# Patient Record
Sex: Male | Born: 1970 | Race: White | Hispanic: No | Marital: Married | Smoking: Former smoker
Health system: Southern US, Community
[De-identification: ages and names within clinical notes are randomized; demographics above are authoritative.]

## PROBLEM LIST (undated history)

## (undated) DIAGNOSIS — R351 Nocturia: Secondary | ICD-10-CM

## (undated) DIAGNOSIS — Z87442 Personal history of urinary calculi: Secondary | ICD-10-CM

## (undated) HISTORY — PX: WISDOM TOOTH EXTRACTION: SHX21

---

## 2016-05-20 ENCOUNTER — Ambulatory Visit (INDEPENDENT_AMBULATORY_CARE_PROVIDER_SITE_OTHER): Payer: BLUE CROSS/BLUE SHIELD | Admitting: Physician Assistant

## 2016-05-20 VITALS — BP 130/90 | HR 65 | Temp 97.7°F | Resp 18 | Ht 71.0 in | Wt 246.0 lb

## 2016-05-20 DIAGNOSIS — Z23 Encounter for immunization: Secondary | ICD-10-CM

## 2016-05-20 DIAGNOSIS — Z1329 Encounter for screening for other suspected endocrine disorder: Secondary | ICD-10-CM | POA: Diagnosis not present

## 2016-05-20 DIAGNOSIS — Z1389 Encounter for screening for other disorder: Secondary | ICD-10-CM | POA: Diagnosis not present

## 2016-05-20 DIAGNOSIS — Z130 Encounter for screening for diseases of the blood and blood-forming organs and certain disorders involving the immune mechanism: Secondary | ICD-10-CM | POA: Diagnosis not present

## 2016-05-20 DIAGNOSIS — R319 Hematuria, unspecified: Secondary | ICD-10-CM | POA: Insufficient documentation

## 2016-05-20 DIAGNOSIS — Z13220 Encounter for screening for lipoid disorders: Secondary | ICD-10-CM | POA: Diagnosis not present

## 2016-05-20 DIAGNOSIS — Z125 Encounter for screening for malignant neoplasm of prostate: Secondary | ICD-10-CM | POA: Diagnosis not present

## 2016-05-20 DIAGNOSIS — Z0000 Encounter for general adult medical examination without abnormal findings: Secondary | ICD-10-CM | POA: Diagnosis not present

## 2016-05-20 LAB — COMPREHENSIVE METABOLIC PANEL
ALT: 17 U/L (ref 9–46)
AST: 16 U/L (ref 10–40)
Albumin: 4.4 g/dL (ref 3.6–5.1)
Alkaline Phosphatase: 58 U/L (ref 40–115)
BUN: 10 mg/dL (ref 7–25)
CHLORIDE: 105 mmol/L (ref 98–110)
CO2: 28 mmol/L (ref 20–31)
CREATININE: 0.96 mg/dL (ref 0.60–1.35)
Calcium: 9.5 mg/dL (ref 8.6–10.3)
GLUCOSE: 91 mg/dL (ref 65–99)
Potassium: 4.3 mmol/L (ref 3.5–5.3)
SODIUM: 139 mmol/L (ref 135–146)
Total Bilirubin: 0.4 mg/dL (ref 0.2–1.2)
Total Protein: 6.9 g/dL (ref 6.1–8.1)

## 2016-05-20 LAB — LIPID PANEL
CHOL/HDL RATIO: 5.3 ratio — AB (ref <=5.0)
CHOLESTEROL: 218 mg/dL — AB (ref 125–200)
HDL: 41 mg/dL (ref >=40)
LDL Cholesterol: 154 mg/dL — ABNORMAL HIGH (ref <130)
Triglycerides: 116 mg/dL (ref <150)
VLDL: 23 mg/dL (ref <30)

## 2016-05-20 LAB — POCT UA - MICROSCOPIC ONLY: MUCUS UA: ABSENT

## 2016-05-20 LAB — CBC
HCT: 46.7 % (ref 38.5–50.0)
Hemoglobin: 16.1 g/dL (ref 13.2–17.1)
MCH: 31.4 pg (ref 27.0–33.0)
MCHC: 34.5 g/dL (ref 32.0–36.0)
MCV: 91 fL (ref 80.0–100.0)
MPV: 10.9 fL (ref 7.5–12.5)
PLATELETS: 212 10*3/uL (ref 140–400)
RBC: 5.13 MIL/uL (ref 4.20–5.80)
RDW: 13.2 % (ref 11.0–15.0)
WBC: 5.8 10*3/uL (ref 3.8–10.8)

## 2016-05-20 LAB — TSH: TSH: 1.84 mIU/L (ref 0.40–4.50)

## 2016-05-20 NOTE — Progress Notes (Signed)
Patient ID: Seraphin Childrey, male    DOB: 06-Nov-1970, 45 y.o.   MRN: [id number]  PCP: No PCP Per Patient  Chief Complaint  Patient presents with  . Annual Exam  . Hematuria    x68month. Hx of kidney stones. Urine collected.    Subjective:   HPI:  56 yom presents for CPE, mentioning hematuria.   Has not been for CPE in 30 years. No medicines at home. 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 Wears a pedometer, logging between 10-16k steps per day. He does not do formal exercise as he is busy and active with work. Does not follow a specific diet. Eats 2-3 servings veggies daily but fruit only 2-3 times per week. As he is busy with long hours eats most meals out at fast food.  Married. Three children. One child still in the house.  Drinks 2-3 beers per week. No drug use. Smoker. Has smoked for 30 years, approx 1 ppd during that time. Has recently decided to cut down. Now smoking 2-3 cigs per day.   Has dentist appt later today. Planning to schedule optometrist appt soon. Unsure last tdap vaccine he had.   Also mentions hx hematuria. Had issues with this 5 years ago at which time was diagnosed with recurrent kidney stones. Unsure what cause was behind them but has not had in 5 years. Now for past month has noticed occasional dark urine every few days. Denies dysuria or flank pain. Denies fevers or chills.   There are no active problems to display for this patient.   Past Medical History:  Diagnosis Date  . Kidney stones      Prior to Admission medications   Not on File    Not on File  No past surgical history on file.  Family History  Problem Relation Age of Onset  . Hyperlipidemia Mother   . Hypertension Mother   . Cancer Father   . Hypertension Father     Social History   Social History  . Marital status: Married    Spouse name: N/A  . Number of children: N/A  . Years of education: N/A   Social History Main Topics  . Smoking status: Current Every Day  Smoker  . Smokeless tobacco: None  . Alcohol use None  . Drug use: Unknown  . Sexual activity: Not Asked   Other Topics Concern  . None   Social History Narrative  . None    Review of Systems Negative unless listed below: Visual problems - worsening vision, eye doctor soon as above  Chest tightness - One episode approx 4 months ago that lasted 2-3 weeks. Under lot of stress at time. Was non exertional. Has not had any since. Has never had exertional CP.  Increased urination - Past few weeks  Urinary freq - Past few weeks  Blood in urine - As above  Color change - Skin slightly darker     Objective:  Physical Exam  Constitutional: He is oriented to person, place, and time. He appears well-developed and well-nourished.  Non-toxic appearance. He does not have a sickly appearance. He does not appear ill. No distress.  HENT:  Right Ear: Tympanic membrane normal.  Left Ear: Tympanic membrane normal.  Nose: Right sinus exhibits no maxillary sinus tenderness and no frontal sinus tenderness. Left sinus exhibits no maxillary sinus tenderness and no frontal sinus tenderness.  Mouth/Throat: Uvula is midline, oropharynx is clear and moist and mucous membranes are normal.  Eyes: Conjunctivae  and EOM are normal. Pupils are equal, round, and reactive to light.  Neck: Trachea normal and normal range of motion. Normal carotid pulses, no hepatojugular reflux and no JVD present. Carotid bruit is not present. No thyroid mass and no thyromegaly present.  Cardiovascular: Normal rate, regular rhythm and normal heart sounds.  PMI is not displaced.   Pulses:      Dorsalis pedis pulses are 2+ on the right side, and 2+ on the left side.       Posterior tibial pulses are 2+ on the right side, and 2+ on the left side.  Pulmonary/Chest: Effort normal and breath sounds normal. No tachypnea.  Abdominal: Soft. Normal appearance and bowel sounds are normal. There is no hepatosplenomegaly. There is no tenderness.  There is no CVA tenderness. No hernia.  Musculoskeletal: Normal range of motion.  Lymphadenopathy:       Head (right side): No submental, no submandibular and no tonsillar adenopathy present.       Head (left side): No submental, no submandibular and no tonsillar adenopathy present.    He has no cervical adenopathy.  Neurological: He is alert and oriented to person, place, and time. He has normal strength. No cranial nerve deficit or sensory deficit.  Psychiatric: He has a normal mood and affect. His speech is normal and behavior is normal.   BP 130/90   Pulse 65   Temp 97.7 F (36.5 C) (Oral)   Resp 18   Ht  (1.803 m)   Wt 246 lb (111.6 kg)   SpO2 96%   BMI 34.31 kg/m   Results for orders placed or performed in visit on 05/20/16  POCT UA - Microscopic Only  Result Value Ref Range   WBC, Ur, HPF, POC none    RBC, urine, microscopic moderate    Bacteria, U Microscopic none    Mucus, UA absent    Epithelial cells, urine per micros few    Crystals, Ur, HPF, POC none    Casts, Ur, LPF, POC none    Yeast, UA none     Assessment & Plan:   Annual physical exam  Screening for deficiency anemia - Plan: CBC  Screening for nephropathy - Plan: Comprehensive metabolic panel  Screening for thyroid disorder - Plan: TSH  Screening for hyperlipidemia - Plan: Lipid panel  Need for Tdap vaccination - Plan: Tdap vaccine greater than or equal to 7yo IM  Hematuria, unspecified - Plan: POCT UA - Microscopic Only, Ambulatory referral to Urology  Screening for prostate cancer - Plan: PSA --cpe today, normal exam with no concerning findings --cbc, cmp, lipids, psa (after discussion weighing pros and cons with pt), tsh today --tdap vaccination today --encouraged regular exercise routine  --rbcs noted on ua after pt complaints of one month of intermittent gross hematuria at home - no unilateral flank pain or cva ttp to suggest renal stone, no recent uri to suggest postinfectious  cause, no signs infection to suggest uti --will refer to urology for possible imaging in this male with significant smoking history. Checking PSA as well to consider BPH as cause   Donnajean Lopes, PA-C Physician Assistant-Certified [facility]  05/20/2016 10:10 PM

## 2016-05-20 NOTE — Patient Instructions (Addendum)
It was wise to come in today for a physical exam.  Your exam was normal.  We are checking multiple labs today and will be in contact with you when these results are back.  You received the tdap vaccine today which will be good for 10 years. You did have red blood cells in your urine today so I've referred you to urology for further work up.   Health Maintenance, Male A healthy lifestyle and preventative care can promote health and wellness.  Maintain regular health, dental, and eye exams.  Eat a healthy diet. Foods like vegetables, fruits, whole grains, low-fat dairy products, and lean protein foods contain the nutrients you need and are low in calories. Decrease your intake of foods high in solid fats, added sugars, and salt. Get information about a proper diet from your health care provider, if necessary.  Regular physical exercise is one of the most important things you can do for your health. Most adults should get at least 150 minutes of moderate-intensity exercise (any activity that increases your heart rate and causes you to sweat) each week. In addition, most adults need muscle-strengthening exercises on 2 or more days a week.   Maintain a healthy weight. The body mass index (BMI) is a screening tool to identify possible weight problems. It provides an estimate of body fat based on height and weight. Your health care provider can find your BMI and can help you achieve or maintain a healthy weight. For males 20 years and older:  A BMI below 18.5 is considered underweight.  A BMI of 18.5 to 24.9 is normal.  A BMI of 25 to 29.9 is considered overweight.  A BMI of 30 and above is considered obese.  Maintain normal blood lipids and cholesterol by exercising and minimizing your intake of saturated fat. Eat a balanced diet with plenty of fruits and vegetables. Blood tests for lipids and cholesterol should begin at age 9 and be repeated every 5 years. If your lipid or cholesterol levels  are high, you are over age 38, or you are at high risk for heart disease, you may need your cholesterol levels checked more frequently.Ongoing high lipid and cholesterol levels should be treated with medicines if diet and exercise are not working.  If you smoke, find out from your health care provider how to quit. If you do not use tobacco, do not start.  Lung cancer screening is recommended for adults aged 55-80 years who are at high risk for developing lung cancer because of a history of smoking. A yearly low-dose CT scan of the lungs is recommended for people who have at least a 30-pack-year history of smoking and are current smokers or have quit within the past 15 years. A pack year of smoking is smoking an average of 1 pack of cigarettes a day for 1 year (for example, a 30-pack-year history of smoking could mean smoking 1 pack a day for 30 years or 2 packs a day for 15 years). Yearly screening should continue until the smoker has stopped smoking for at least 15 years. Yearly screening should be stopped for people who develop a health problem that would prevent them from having lung cancer treatment.  If you choose to drink alcohol, do not have more than 2 drinks per day. One drink is considered to be 12 oz (360 mL) of beer, 5 oz (150 mL) of wine, or 1.5 oz (45 mL) of liquor.  Avoid the use of street drugs. Do not  share needles with anyone. Ask for help if you need support or instructions about stopping the use of drugs.  High blood pressure causes heart disease and increases the risk of stroke. High blood pressure is more likely to develop in:  People who have blood pressure in the end of the normal range (100-139/85-89 mm Hg).  People who are overweight or obese.  People who are African American.  If you are 20-62 years of age, have your blood pressure checked every 3-5 years. If you are 67 years of age or older, have your blood pressure checked every year. You should have your blood  pressure measured twice--once when you are at a hospital or clinic, and once when you are not at a hospital or clinic. Record the average of the two measurements. To check your blood pressure when you are not at a hospital or clinic, you can use:  An automated blood pressure machine at a pharmacy.  A home blood pressure monitor.  If you are 70-11 years old, ask your health care provider if you should take aspirin to prevent heart disease.  Diabetes screening involves taking a blood sample to check your fasting blood sugar level. This should be done once every 3 years after age 96 if you are at a normal weight and without risk factors for diabetes. Testing should be considered at a younger age or be carried out more frequently if you are overweight and have at least 1 risk factor for diabetes.  Colorectal cancer can be detected and often prevented. Most routine colorectal cancer screening begins at the age of 39 and continues through age 41. However, your health care provider may recommend screening at an earlier age if you have risk factors for colon cancer. On a yearly basis, your health care provider may provide home test kits to check for hidden blood in the stool. A small camera at the end of a tube may be used to directly examine the colon (sigmoidoscopy or colonoscopy) to detect the earliest forms of colorectal cancer. Talk to your health care provider about this at age 45 when routine screening begins. A direct exam of the colon should be repeated every 5-10 years through age 40, unless early forms of precancerous polyps or small growths are found.  People who are at an increased risk for hepatitis B should be screened for this virus. You are considered at high risk for hepatitis B if:  You were born in a country where hepatitis B occurs often. Talk with your health care provider about which countries are considered high risk.  Your parents were born in a high-risk country and you have not  received a shot to protect against hepatitis B (hepatitis B vaccine).  You have HIV or AIDS.  You use needles to inject street drugs.  You live with, or have sex with, someone who has hepatitis B.  You are a man who has sex with other men (MSM).  You get hemodialysis treatment.  You take certain medicines for conditions like cancer, organ transplantation, and autoimmune conditions.  Hepatitis C blood testing is recommended for all people born from [birth year] through 1967 and any individual with known risk factors for hepatitis C.  Healthy men should no longer receive prostate-specific antigen (PSA) blood tests as part of routine cancer screening. Talk to your health care provider about prostate cancer screening.  Testicular cancer screening is not recommended for adolescents or adult males who have no symptoms. Screening includes self-exam, a health  care provider exam, and other screening tests. Consult with your health care provider about any symptoms you have or any concerns you have about testicular cancer.  Practice safe sex. Use condoms and avoid high-risk sexual practices to reduce the spread of sexually transmitted infections (STIs).  You should be screened for STIs, including gonorrhea and chlamydia if:  You are sexually active and are younger than 24 years.  You are older than 24 years, and your health care provider tells you that you are at risk for this type of infection.  Your sexual activity has changed since you were last screened, and you are at an increased risk for chlamydia or gonorrhea. Ask your health care provider if you are at risk.  If you are at risk of being infected with HIV, it is recommended that you take a prescription medicine daily to prevent HIV infection. This is called pre-exposure prophylaxis (PrEP). You are considered at risk if:  You are a man who has sex with other men (MSM).  You are a heterosexual man who is sexually active with multiple  partners.  You take drugs by injection.  You are sexually active with a partner who has HIV.  Talk with your health care provider about whether you are at high risk of being infected with HIV. If you choose to begin PrEP, you should first be tested for HIV. You should then be tested every 3 months for as long as you are taking PrEP.  Use sunscreen. Apply sunscreen liberally and repeatedly throughout the day. You should seek shade when your shadow is shorter than you. Protect yourself by wearing long sleeves, pants, a wide-brimmed hat, and sunglasses year round whenever you are outdoors.  Tell your health care provider of new moles or changes in moles, especially if there is a change in shape or color. Also, tell your health care provider if a mole is larger than the size of a pencil eraser.  A one-time screening for abdominal aortic aneurysm (AAA) and surgical repair of large AAAs by ultrasound is recommended for men aged 65-75 years who are current or former smokers.  Stay current with your vaccines (immunizations).   This information is not intended to replace advice given to you by your health care provider. Make sure you discuss any questions you have with your health care provider.   Document Released: 04/08/2008 Document Revised: 11/01/2014 Document Reviewed: 03/08/2011 Elsevier Interactive Patient Education 2016 [employer].  Tdap Vaccine (Tetanus, Diphtheria and Pertussis): What You Need to Know 1. Why get vaccinated? Tetanus, diphtheria and pertussis are very serious diseases. Tdap vaccine can protect [place] from these diseases. And, Tdap vaccine given to pregnant women can protect newborn babies against pertussis. TETANUS (Lockjaw) is rare in the [place] today. It causes painful muscle tightening and stiffness, usually all over the body.  It can lead to tightening of muscles in the head and neck so you can't open your mouth, swallow, or sometimes even breathe. Tetanus kills  about 1 out of 10 people who are infected even after receiving the best medical care. DIPHTHERIA is also rare in the [place] today. It can cause a thick coating to form in the back of the throat.  It can lead to breathing problems, heart failure, paralysis, and death. PERTUSSIS (Whooping Cough) causes severe coughing spells, which can cause difficulty breathing, vomiting and disturbed sleep.  It can also lead to weight loss, incontinence, and rib fractures. Up to 2 in 100 adolescents and 5  in 100 adults with pertussis are hospitalized or have complications, which could include pneumonia or death. These diseases are caused by bacteria. Diphtheria and pertussis are spread from person to person through secretions from coughing or sneezing. Tetanus enters the body through cuts, scratches, or wounds. Before vaccines, as many as 200,000 cases of diphtheria, 200,000 cases of pertussis, and hundreds of cases of tetanus, were reported in the [place] each year. Since vaccination began, reports of cases for tetanus and diphtheria have dropped by about 99% and for pertussis by about 80%. 2. Tdap vaccine Tdap vaccine can protect adolescents and adults from tetanus, diphtheria, and pertussis. One dose of Tdap is routinely given at age 73 or 50. People who did not get Tdap at that age should get it as soon as possible. Tdap is especially important for healthcare professionals and anyone having close contact with a baby younger than 12 months. Pregnant women should get a dose of Tdap during every pregnancy, to protect the newborn from pertussis. Infants are most at risk for severe, life-threatening complications from pertussis. Another vaccine, called Td, protects against tetanus and diphtheria, but not pertussis. A Td booster should be given every 10 years. Tdap may be given as one of these boosters if you have never gotten Tdap before. Tdap may also be given after a severe cut or burn to prevent tetanus  infection. Your doctor or the person giving you the vaccine can give you more information. Tdap may safely be given at the same time as other vaccines. 3. Some people should not get this vaccine  A person who has ever had a life-threatening allergic reaction after a previous dose of any diphtheria, tetanus or pertussis containing vaccine, OR has a severe allergy to any part of this vaccine, should not get Tdap vaccine. Tell the person giving the vaccine about any severe allergies.  Anyone who had coma or long repeated seizures within 7 days after a childhood dose of DTP or DTaP, or a previous dose of Tdap, should not get Tdap, unless a cause other than the vaccine was found. They can still get Td.  Talk to your doctor if you:  have seizures or another nervous system problem,  had severe pain or swelling after any vaccine containing diphtheria, tetanus or pertussis,  ever had a condition called Guillain-Barr Syndrome (GBS),  aren't feeling well on the day the shot is scheduled. 4. Risks With any medicine, including vaccines, there is a chance of side effects. These are usually mild and go away on their own. Serious reactions are also possible but are rare. Most people who get Tdap vaccine do not have any problems with it. Mild problems following Tdap (Did not interfere with activities)  Pain where the shot was given (about 3 in 4 adolescents or 2 in 3 adults)  Redness or swelling where the shot was given (about 1 person in 5)  Mild fever of at least 100.51F (up to about 1 in 25 adolescents or 1 in 100 adults)  Headache (about 3 or 4 people in 10)  Tiredness (about 1 person in 3 or 4)  Nausea, vomiting, diarrhea, stomach ache (up to 1 in 4 adolescents or 1 in 10 adults)  Chills, sore joints (about 1 person in 10)  Body aches (about 1 person in 3 or 4)  Rash, swollen glands (uncommon) Moderate problems following Tdap (Interfered with activities, but did not require medical  attention)  Pain where the shot was given (up to 1  in 5 or 6)  Redness or swelling where the shot was given (up to about 1 in 16 adolescents or 1 in 12 adults)  Fever over 102F (about 1 in 100 adolescents or 1 in 250 adults)  Headache (about 1 in 7 adolescents or 1 in 10 adults)  Nausea, vomiting, diarrhea, stomach ache (up to 1 or 3 people in 100)  Swelling of the entire arm where the shot was given (up to about 1 in 500). Severe problems following Tdap (Unable to perform usual activities; required medical attention)  Swelling, severe pain, bleeding and redness in the arm where the shot was given (rare). Problems that could happen after any vaccine:  People sometimes faint after a medical procedure, including vaccination. Sitting or lying down for about 15 minutes can help prevent fainting, and injuries caused by a fall. Tell your doctor if you feel dizzy, or have vision changes or ringing in the ears.  Some people get severe pain in the shoulder and have difficulty moving the arm where a shot was given. This happens very rarely.  Any medication can cause a severe allergic reaction. Such reactions from a vaccine are very rare, estimated at fewer than 1 in a million doses, and would happen within a few minutes to a few hours after the vaccination. As with any medicine, there is a very remote chance of a vaccine causing a serious injury or death. The safety of vaccines is always being monitored. For more information, visit: http://floyd.org/ 5. What if there is a serious problem? What should I look for?  Look for anything that concerns you, such as signs of a severe allergic reaction, very high fever, or unusual behavior.  Signs of a severe allergic reaction can include hives, swelling of the face and throat, difficulty breathing, a fast heartbeat, dizziness, and weakness. These would usually start a few minutes to a few hours after the vaccination. What should I do?  If  you think it is a severe allergic reaction or other emergency that can't wait, call 9-1-1 or get the person to the nearest hospital. Otherwise, call your doctor.  Afterward, the reaction should be reported to the Vaccine Adverse Event Reporting System (VAERS). Your doctor might file this report, or you can do it yourself through the VAERS web site at www.vaers.LAgents.no, or by calling 1-519-876-0086. VAERS does not give medical advice.  6. The National Vaccine Injury Compensation Program The [employer] Vaccine Injury Compensation Program (VICP) is a federal program that was created to compensate people who may have been injured by certain vaccines. Persons who believe they may have been injured by a vaccine can learn about the program and about filing a claim by calling 1-(469)625-1281 or visiting the VICP website at SpiritualWord.at. There is a time limit to file a claim for compensation. 7. How can I learn more?  Ask your doctor. He or she can give you the vaccine package insert or suggest other sources of information.  Call your local or state health department.  Contact the Centers for Disease Control and Prevention (CDC):  Call 667-371-5798 (1-800-CDC-INFO) or  Visit CDC's website at PicCapture.uy CDC Tdap Vaccine VIS (12/18/13)   This information is not intended to replace advice given to you by your health care provider. Make sure you discuss any questions you have with your health care provider.   Document Released: 04/11/2012 Document Revised: 11/01/2014 Document Reviewed: 01/23/2014 Elsevier Interactive Patient Education [employer]

## 2016-05-21 LAB — PSA: PSA: 1.28 ng/mL (ref <=4.00)

## 2016-07-02 ENCOUNTER — Ambulatory Visit (HOSPITAL_COMMUNITY)
Admission: RE | Admit: 2016-07-02 | Discharge: 2016-07-02 | Disposition: A | Discharge status: Home / Self Care | Payer: BLUE CROSS/BLUE SHIELD | Source: Ambulatory Visit | Attending: Urology | Admitting: Urology

## 2016-07-02 ENCOUNTER — Other Ambulatory Visit: Payer: Self-pay | Admitting: Urology

## 2016-07-02 ENCOUNTER — Ambulatory Visit (INDEPENDENT_AMBULATORY_CARE_PROVIDER_SITE_OTHER): Payer: BLUE CROSS/BLUE SHIELD | Admitting: Urology

## 2016-07-02 ENCOUNTER — Encounter (HOSPITAL_COMMUNITY): Payer: Self-pay

## 2016-07-02 DIAGNOSIS — R310 Gross hematuria: Secondary | ICD-10-CM | POA: Insufficient documentation

## 2016-07-02 DIAGNOSIS — Z87442 Personal history of urinary calculi: Secondary | ICD-10-CM

## 2016-07-02 DIAGNOSIS — N200 Calculus of kidney: Secondary | ICD-10-CM | POA: Insufficient documentation

## 2016-07-02 MED ORDER — IOPAMIDOL (ISOVUE-300) INJECTION 61%
INTRAVENOUS | Status: AC
  Administered 2016-07-02: 100 mL
  Filled 2016-07-02: qty 100

## 2016-07-16 ENCOUNTER — Ambulatory Visit (INDEPENDENT_AMBULATORY_CARE_PROVIDER_SITE_OTHER): Payer: BLUE CROSS/BLUE SHIELD | Admitting: Urology

## 2016-07-16 DIAGNOSIS — R310 Gross hematuria: Secondary | ICD-10-CM

## 2016-07-16 DIAGNOSIS — N200 Calculus of kidney: Secondary | ICD-10-CM

## 2016-07-16 DIAGNOSIS — Q6239 Other obstructive defects of renal pelvis and ureter: Secondary | ICD-10-CM

## 2016-07-28 ENCOUNTER — Other Ambulatory Visit: Payer: Self-pay | Admitting: Urology

## 2016-07-28 ENCOUNTER — Ambulatory Visit (INDEPENDENT_AMBULATORY_CARE_PROVIDER_SITE_OTHER): Payer: BLUE CROSS/BLUE SHIELD | Admitting: Urology

## 2016-07-28 DIAGNOSIS — N135 Crossing vessel and stricture of ureter without hydronephrosis: Secondary | ICD-10-CM

## 2016-07-28 NOTE — Progress Notes (Signed)
Pt is being scheduled for preop appt; please place surgical orders in epic. Thanks.  

## 2016-08-13 ENCOUNTER — Encounter (HOSPITAL_COMMUNITY)
Admission: RE | Admit: 2016-08-13 | Discharge: 2016-08-13 | Disposition: A | Discharge status: Home / Self Care | Payer: BLUE CROSS/BLUE SHIELD | Source: Ambulatory Visit | Attending: Urology | Admitting: Urology

## 2016-08-13 ENCOUNTER — Encounter (HOSPITAL_COMMUNITY): Payer: Self-pay

## 2016-08-13 DIAGNOSIS — Z01812 Encounter for preprocedural laboratory examination: Secondary | ICD-10-CM | POA: Insufficient documentation

## 2016-08-13 DIAGNOSIS — Z0183 Encounter for blood typing: Secondary | ICD-10-CM | POA: Insufficient documentation

## 2016-08-13 HISTORY — DX: Nocturia: R35.1

## 2016-08-13 LAB — CBC
HCT: 41.5 % (ref 39.0–52.0)
HEMOGLOBIN: 14.9 g/dL (ref 13.0–17.0)
MCH: 31.4 pg (ref 26.0–34.0)
MCHC: 35.9 g/dL (ref 30.0–36.0)
MCV: 87.4 fL (ref 78.0–100.0)
Platelets: 195 10*3/uL (ref 150–400)
RBC: 4.75 MIL/uL (ref 4.22–5.81)
RDW: 12.6 % (ref 11.5–15.5)
WBC: 5.1 10*3/uL (ref 4.0–10.5)

## 2016-08-13 LAB — ABO/RH: ABO/RH(D): O POS

## 2016-08-13 NOTE — Pre-Procedure Instructions (Signed)
Pt states he has not been to a doctor's appointment or had a physical in over 25 years.  Will get CBC during pre-op visit today.

## 2016-08-13 NOTE — Patient Instructions (Addendum)
Jeremy PigeonScott Cummings  08/13/2016   Your procedure is scheduled on: 08-20-16  [facility]  [facility] to  Short Stay Center at 10:15AM.  Call this number if you have problems the morning of surgery [phone]   Remember: ONLY 1 PERSON MAY GO WITH YOU TO SHORT STAY TO GET  READY MORNING OF YOUR SURGERY.  Do not eat food or drink liquids :After Midnight. Be sure to follow bowel prep instructions from Dr. Dimas MillinMckenzie's office.     Take these medicines the morning of surgery with A SIP OF WATER: None                               You may not have any metal on your body including hair pins and              piercings  Do not wear jewelry, make-up, lotions, powders or perfumes, deodorant             Do not wear nail polish.  Do not shave  48 hours prior to surgery.              Men may shave face and neck.   Do not bring valuables to the hospital. [facility] IS NOT             RESPONSIBLE   FOR VALUABLES.  Contacts, dentures or bridgework may not be worn into surgery.  Leave suitcase in the car. After surgery it may be brought to your room.               Please read over the following fact sheets you were given: _____________________________________________________________________             [facility] Pre[facility] Before surgery, you can play an important role.  Because skin is not sterile, your skin needs to be as free of germs as possible.  You can reduce the number of germs on your skin by washing with CHG (chlorahexidine gluconate) soap before surgery.  CHG is an antiseptic cleaner which kills germs and bonds with the skin to continue killing germs even after washing. Please DO NOT use if you have an allergy to CHG or antibacterial soaps.  If your skin becomes reddened/irritated stop using the CHG and inform your nurse when you arrive at Short Stay. Do not shave (including legs and underarms) for at least  48 hours prior to the first CHG shower.  You may shave your face/neck. Please follow these instructions carefully:  1.  Shower with CHG Soap the night before surgery and the  morning of Surgery.  2.  If you choose to wash your hair, wash your hair first as usual with your  normal  shampoo.  3.  After you shampoo, rinse your hair and body thoroughly to remove the  shampoo.                           4.  Use CHG as you would any other liquid soap.  You can apply chg directly  to the skin and wash                       Gently with a scrungie or clean washcloth.  5.  Apply the CHG Soap to your body ONLY FROM THE NECK DOWN.   Do not use on face/ open                           Wound or open sores. Avoid contact with eyes, ears mouth and genitals (private parts).                       Wash face,  Genitals (private parts) with your normal soap.             6.  Wash thoroughly, paying special attention to the area where your surgery  will be performed.  7.  Thoroughly rinse your body with warm water from the neck down.  8.  DO NOT shower/wash with your normal soap after using and rinsing off  the CHG Soap.                9.  Pat yourself dry with a clean towel.            10.  Wear clean pajamas.            11.  Place clean sheets on your bed the night of your first shower and do not  sleep with pets. Day of Surgery : Do not apply any lotions/deodorants the morning of surgery.  Please wear clean clothes to the hospital/surgery center.  FAILURE TO FOLLOW THESE INSTRUCTIONS MAY RESULT IN THE CANCELLATION OF YOUR SURGERY PATIENT SIGNATURE_________________________________  NURSE SIGNATURE__________________________________  ________________________________________________________________________  WHAT IS A BLOOD TRANSFUSION? Blood Transfusion Information  A transfusion is the replacement of blood or some of its parts. Blood is made up of multiple cells which provide different functions.  Red blood  cells carry oxygen and are used for blood loss replacement.  White blood cells fight against infection.  Platelets control bleeding.  Plasma helps clot blood.  Other blood products are available for specialized needs, such as hemophilia or other clotting disorders. BEFORE THE TRANSFUSION  Who gives blood for transfusions?   Healthy volunteers who are fully evaluated to make sure their blood is safe. This is blood bank blood. Transfusion therapy is the safest it has ever been in the practice of medicine. Before blood is taken from a donor, a complete history is taken to make sure that person has no history of diseases nor engages in risky social behavior (examples are intravenous drug use or sexual activity with multiple partners). The donor's travel history is screened to minimize risk of transmitting infections, such as malaria. The donated blood is tested for signs of infectious diseases, such as HIV and hepatitis. The blood is then tested to be sure it is compatible with you in order to minimize the chance of a transfusion reaction. If you or a relative donates blood, this is often done in anticipation of surgery and is not appropriate for emergency situations. It takes many days to process the donated blood. RISKS AND COMPLICATIONS Although transfusion therapy is very safe and saves many lives, the main dangers of transfusion include:   Getting an infectious disease.  Developing a transfusion reaction. This is an allergic reaction to something in the blood you were given. Every precaution is taken to prevent this. The decision to have a blood transfusion has been considered carefully by your caregiver before blood is given. Blood is not given unless the benefits outweigh the risks. AFTER THE TRANSFUSION  Right after receiving a  blood transfusion, you will usually feel much better and more energetic. This is especially true if your red blood cells have gotten low (anemic). The transfusion  raises the level of the red blood cells which carry oxygen, and this usually causes an energy increase.  The nurse administering the transfusion will monitor you carefully for complications. HOME CARE INSTRUCTIONS  No special instructions are needed after a transfusion. You may find your energy is better. Speak with your caregiver about any limitations on activity for underlying diseases you may have. SEEK MEDICAL CARE IF:   Your condition is not improving after your transfusion.  You develop redness or irritation at the intravenous (IV) site. SEEK IMMEDIATE MEDICAL CARE IF:  Any of the following symptoms occur over the next 12 hours:  Shaking chills.  You have a temperature by mouth above 102 F (38.9 C), not controlled by medicine.  Chest, back, or muscle pain.  People around you feel you are not acting correctly or are confused.  Shortness of breath or difficulty breathing.  Dizziness and fainting.  You get a rash or develop hives.  You have a decrease in urine output.  Your urine turns a dark color or changes to pink, red, or brown. Any of the following symptoms occur over the next 10 days:  You have a temperature by mouth above 102 F (38.9 C), not controlled by medicine.  Shortness of breath.  Weakness after normal activity.  The white part of the eye turns yellow (jaundice).  You have a decrease in the amount of urine or are urinating less often.  Your urine turns a dark color or changes to pink, red, or brown. Document Released: 10/08/2000 Document Revised: 01/03/2012 Document Reviewed: 05/27/2008 [facility] Patient Information 2014 [place], [place].  _______________________________________________________________________

## 2016-08-20 ENCOUNTER — Inpatient Hospital Stay (HOSPITAL_COMMUNITY)
Admission: RE | Admit: 2016-08-20 | Discharge: 2016-08-22 | DRG: 661 | Disposition: A | Discharge status: Home / Self Care | Payer: BLUE CROSS/BLUE SHIELD | Source: Ambulatory Visit | Attending: Urology | Admitting: Urology

## 2016-08-20 ENCOUNTER — Inpatient Hospital Stay (HOSPITAL_COMMUNITY): Payer: BLUE CROSS/BLUE SHIELD | Admitting: Certified Registered Nurse Anesthetist

## 2016-08-20 ENCOUNTER — Encounter (HOSPITAL_COMMUNITY)
Admission: RE | Disposition: A | Discharge status: Home / Self Care | Payer: Self-pay | Source: Ambulatory Visit | Attending: Urology

## 2016-08-20 ENCOUNTER — Encounter (HOSPITAL_COMMUNITY): Payer: Self-pay | Admitting: *Deleted

## 2016-08-20 ENCOUNTER — Inpatient Hospital Stay (HOSPITAL_COMMUNITY): Payer: BLUE CROSS/BLUE SHIELD

## 2016-08-20 DIAGNOSIS — N200 Calculus of kidney: Secondary | ICD-10-CM | POA: Diagnosis present

## 2016-08-20 DIAGNOSIS — Z23 Encounter for immunization: Secondary | ICD-10-CM | POA: Diagnosis not present

## 2016-08-20 DIAGNOSIS — Z79899 Other long term (current) drug therapy: Secondary | ICD-10-CM | POA: Diagnosis not present

## 2016-08-20 DIAGNOSIS — Z87891 Personal history of nicotine dependence: Secondary | ICD-10-CM | POA: Diagnosis not present

## 2016-08-20 DIAGNOSIS — N135 Crossing vessel and stricture of ureter without hydronephrosis: Secondary | ICD-10-CM | POA: Diagnosis present

## 2016-08-20 DIAGNOSIS — Z7982 Long term (current) use of aspirin: Secondary | ICD-10-CM | POA: Diagnosis not present

## 2016-08-20 DIAGNOSIS — Z809 Family history of malignant neoplasm, unspecified: Secondary | ICD-10-CM | POA: Diagnosis not present

## 2016-08-20 DIAGNOSIS — Z8249 Family history of ischemic heart disease and other diseases of the circulatory system: Secondary | ICD-10-CM

## 2016-08-20 HISTORY — PX: CYSTOSCOPY W/ URETERAL STENT PLACEMENT: SHX1429

## 2016-08-20 HISTORY — PX: ROBOTIC ASSISTED LAPAROSCOPIC LYSIS OF ADHESION: SHX6080

## 2016-08-20 LAB — TYPE AND SCREEN
ABO/RH(D): O POS
ANTIBODY SCREEN: NEGATIVE

## 2016-08-20 LAB — HEMOGLOBIN AND HEMATOCRIT, BLOOD
HEMATOCRIT: 41.1 % (ref 39.0–52.0)
HEMOGLOBIN: 14.4 g/dL (ref 13.0–17.0)

## 2016-08-20 SURGERY — LYSIS, ADHESIONS, ROBOT-ASSISTED, LAPAROSCOPIC
Anesthesia: General | Laterality: Left

## 2016-08-20 MED ORDER — MIDAZOLAM HCL 5 MG/5ML IJ SOLN
INTRAMUSCULAR | Status: DC | PRN
  Administered 2016-08-20: 2 mg via INTRAVENOUS

## 2016-08-20 MED ORDER — LIDOCAINE 2% (20 MG/ML) 5 ML SYRINGE
INTRAMUSCULAR | Status: AC
  Filled 2016-08-20: qty 5

## 2016-08-20 MED ORDER — MANNITOL 25 % IV SOLN
INTRAVENOUS | Status: AC
  Filled 2016-08-20: qty 50

## 2016-08-20 MED ORDER — EPHEDRINE SULFATE 50 MG/ML IJ SOLN
INTRAMUSCULAR | Status: DC | PRN
  Administered 2016-08-20: 10 mg via INTRAVENOUS

## 2016-08-20 MED ORDER — PROMETHAZINE HCL 25 MG/ML IJ SOLN: 6.2500 mg | INTRAMUSCULAR | Status: DC | PRN

## 2016-08-20 MED ORDER — CEFAZOLIN SODIUM-DEXTROSE 2-4 GM/100ML-% IV SOLN
INTRAVENOUS | Status: AC
Start: 2016-08-20 — End: 2016-08-20
  Filled 2016-08-20: qty 100

## 2016-08-20 MED ORDER — SCOPOLAMINE 1 MG/3DAYS TD PT72
1.0000 | MEDICATED_PATCH | TRANSDERMAL | Status: DC
  Administered 2016-08-20: 1 via TRANSDERMAL

## 2016-08-20 MED ORDER — WATER FOR IRRIGATION, STERILE IR SOLN
Status: DC | PRN
  Administered 2016-08-20: 3000 mL

## 2016-08-20 MED ORDER — HYDROMORPHONE HCL 1 MG/ML IJ SOLN: 0.5000 mg | INTRAMUSCULAR | Status: DC | PRN

## 2016-08-20 MED ORDER — DEXAMETHASONE SODIUM PHOSPHATE 10 MG/ML IJ SOLN
INTRAMUSCULAR | Status: DC | PRN
  Administered 2016-08-20: 10 mg via INTRAVENOUS

## 2016-08-20 MED ORDER — KETOROLAC TROMETHAMINE 30 MG/ML IJ SOLN
30.0000 mg | Freq: Four times a day (QID) | INTRAMUSCULAR | Status: AC
  Administered 2016-08-20 – 2016-08-22 (×6): 30 mg via INTRAVENOUS
  Filled 2016-08-20 (×6): qty 1

## 2016-08-20 MED ORDER — EPHEDRINE 5 MG/ML INJ
INTRAVENOUS | Status: AC
  Filled 2016-08-20: qty 10

## 2016-08-20 MED ORDER — HYDROMORPHONE HCL 1 MG/ML IJ SOLN
INTRAMUSCULAR | Status: AC
  Filled 2016-08-20: qty 1

## 2016-08-20 MED ORDER — ROCURONIUM BROMIDE 100 MG/10ML IV SOLN
INTRAVENOUS | Status: DC | PRN
  Administered 2016-08-20 (×2): 20 mg via INTRAVENOUS
  Administered 2016-08-20: 50 mg via INTRAVENOUS
  Administered 2016-08-20 (×2): 10 mg via INTRAVENOUS

## 2016-08-20 MED ORDER — FENTANYL CITRATE (PF) 250 MCG/5ML IJ SOLN
INTRAMUSCULAR | Status: AC
  Filled 2016-08-20: qty 5

## 2016-08-20 MED ORDER — IOHEXOL 300 MG/ML  SOLN
INTRAMUSCULAR | Status: DC | PRN
Start: 2016-08-20 — End: 2016-08-20
  Administered 2016-08-20: 9 mL via URETHRAL

## 2016-08-20 MED ORDER — HYDROCODONE-ACETAMINOPHEN 5-325 MG PO TABS
1.0000 | ORAL_TABLET | ORAL | Status: DC | PRN
  Administered 2016-08-22: 1 via ORAL
  Filled 2016-08-20: qty 1

## 2016-08-20 MED ORDER — FENTANYL CITRATE (PF) 100 MCG/2ML IJ SOLN
INTRAMUSCULAR | Status: DC | PRN
  Administered 2016-08-20 (×5): 50 ug via INTRAVENOUS

## 2016-08-20 MED ORDER — ACETAMINOPHEN 325 MG PO TABS: 650.0000 mg | ORAL_TABLET | ORAL | Status: DC | PRN

## 2016-08-20 MED ORDER — SUGAMMADEX SODIUM 200 MG/2ML IV SOLN
INTRAVENOUS | Status: DC | PRN
  Administered 2016-08-20: 350 mg via INTRAVENOUS

## 2016-08-20 MED ORDER — SODIUM CHLORIDE 0.9 % IJ SOLN
INTRAMUSCULAR | Status: AC
  Filled 2016-08-20: qty 50

## 2016-08-20 MED ORDER — ROCURONIUM BROMIDE 50 MG/5ML IV SOSY
PREFILLED_SYRINGE | INTRAVENOUS | Status: AC
  Filled 2016-08-20: qty 5

## 2016-08-20 MED ORDER — DIPHENHYDRAMINE HCL 50 MG/ML IJ SOLN: 12.5000 mg | Freq: Four times a day (QID) | INTRAMUSCULAR | Status: DC | PRN

## 2016-08-20 MED ORDER — CEFAZOLIN SODIUM-DEXTROSE 2-4 GM/100ML-% IV SOLN
2.0000 g | INTRAVENOUS | Status: AC
  Administered 2016-08-20: 2 g via INTRAVENOUS
  Filled 2016-08-20: qty 100

## 2016-08-20 MED ORDER — HYDROCODONE-ACETAMINOPHEN 5-325 MG PO TABS: 1.0000 | ORAL_TABLET | Freq: Four times a day (QID) | ORAL | 0 refills | Status: DC | PRN

## 2016-08-20 MED ORDER — HYDROMORPHONE HCL 1 MG/ML IJ SOLN
0.2500 mg | INTRAMUSCULAR | Status: DC | PRN
  Administered 2016-08-20 (×2): 0.5 mg via INTRAVENOUS

## 2016-08-20 MED ORDER — MIDAZOLAM HCL 2 MG/2ML IJ SOLN
INTRAMUSCULAR | Status: AC
  Filled 2016-08-20: qty 2

## 2016-08-20 MED ORDER — ONDANSETRON HCL 4 MG/2ML IJ SOLN: 4.0000 mg | INTRAMUSCULAR | Status: DC | PRN

## 2016-08-20 MED ORDER — INFLUENZA VAC SPLIT QUAD 0.5 ML IM SUSY
0.5000 mL | PREFILLED_SYRINGE | INTRAMUSCULAR | Status: AC
  Administered 2016-08-21: 0.5 mL via INTRAMUSCULAR
  Filled 2016-08-20: qty 0.5

## 2016-08-20 MED ORDER — DIPHENHYDRAMINE HCL 12.5 MG/5ML PO ELIX: 12.5000 mg | ORAL_SOLUTION | Freq: Four times a day (QID) | ORAL | Status: DC | PRN

## 2016-08-20 MED ORDER — HYDROMORPHONE HCL 1 MG/ML IJ SOLN
INTRAMUSCULAR | Status: DC | PRN
  Administered 2016-08-20 (×2): .4 mg via INTRAVENOUS

## 2016-08-20 MED ORDER — LACTATED RINGERS IV SOLN
INTRAVENOUS | Status: DC
  Administered 2016-08-20: 12:00:00 via INTRAVENOUS

## 2016-08-20 MED ORDER — BUPIVACAINE LIPOSOME 1.3 % IJ SUSP
INTRAMUSCULAR | Status: DC | PRN
  Administered 2016-08-20: 37 mL

## 2016-08-20 MED ORDER — BUPIVACAINE LIPOSOME 1.3 % IJ SUSP
INTRAMUSCULAR | Status: AC
  Filled 2016-08-20: qty 20

## 2016-08-20 MED ORDER — HYDROMORPHONE HCL 2 MG/ML IJ SOLN
INTRAMUSCULAR | Status: AC
  Filled 2016-08-20: qty 1

## 2016-08-20 MED ORDER — DEXAMETHASONE SODIUM PHOSPHATE 10 MG/ML IJ SOLN
INTRAMUSCULAR | Status: AC
  Filled 2016-08-20: qty 1

## 2016-08-20 MED ORDER — DEXTROSE-NACL 5-0.45 % IV SOLN
INTRAVENOUS | Status: DC
  Administered 2016-08-20 – 2016-08-21 (×2): via INTRAVENOUS

## 2016-08-20 MED ORDER — ONDANSETRON HCL 4 MG/2ML IJ SOLN
INTRAMUSCULAR | Status: AC
  Filled 2016-08-20: qty 2

## 2016-08-20 MED ORDER — PROPOFOL 10 MG/ML IV BOLUS
INTRAVENOUS | Status: AC
  Filled 2016-08-20: qty 20

## 2016-08-20 MED ORDER — LIDOCAINE HCL (CARDIAC) 20 MG/ML IV SOLN
INTRAVENOUS | Status: DC | PRN
  Administered 2016-08-20: 100 mg via INTRAVENOUS

## 2016-08-20 MED ORDER — MANNITOL 25 % IV SOLN
INTRAVENOUS | Status: AC
Start: 2016-08-20 — End: 2016-08-20
  Filled 2016-08-20: qty 50

## 2016-08-20 MED ORDER — PROPOFOL 10 MG/ML IV BOLUS
INTRAVENOUS | Status: DC | PRN
  Administered 2016-08-20: 180 mg via INTRAVENOUS

## 2016-08-20 SURGICAL SUPPLY — 57 items
CATH INTERMIT  6FR 70CM (CATHETERS) IMPLANT
CHLORAPREP W/TINT 26ML (MISCELLANEOUS) ×2 IMPLANT
CLIP LIGATING HEM O LOK PURPLE (MISCELLANEOUS) IMPLANT
CLIP LIGATING HEMO O LOK GREEN (MISCELLANEOUS) IMPLANT
COVER TIP SHEARS 8 DVNC (MISCELLANEOUS) ×1 IMPLANT
COVER TIP SHEARS 8MM DA VINCI (MISCELLANEOUS) ×1
DECANTER SPIKE VIAL GLASS SM (MISCELLANEOUS) ×2 IMPLANT
DERMABOND ADVANCED (GAUZE/BANDAGES/DRESSINGS) ×1
DERMABOND ADVANCED .7 DNX12 (GAUZE/BANDAGES/DRESSINGS) ×1 IMPLANT
DRAIN CHANNEL 15F RND FF 3/16 (WOUND CARE) ×2 IMPLANT
DRAPE ARM DVNC X/XI (DISPOSABLE) ×4 IMPLANT
DRAPE COLUMN DVNC XI (DISPOSABLE) IMPLANT
DRAPE DA VINCI XI ARM (DISPOSABLE) ×4
DRAPE DA VINCI XI COLUMN (DISPOSABLE)
DRAPE INCISE IOBAN 66X45 STRL (DRAPES) ×2 IMPLANT
DRAPE LAPAROSCOPIC ABDOMINAL (DRAPES) ×2 IMPLANT
DRAPE SHEET LG 3/4 BI-LAMINATE (DRAPES) ×2 IMPLANT
ELECT PENCIL ROCKER SW 15FT (MISCELLANEOUS) ×2 IMPLANT
ELECT REM PT RETURN 9FT ADLT (ELECTROSURGICAL) ×2
ELECTRODE REM PT RTRN 9FT ADLT (ELECTROSURGICAL) ×1 IMPLANT
EVACUATOR SILICONE 100CC (DRAIN) ×2 IMPLANT
GLOVE BIO SURGEON STRL SZ 6.5 (GLOVE) ×2 IMPLANT
GLOVE BIO SURGEON STRL SZ8 (GLOVE) ×4 IMPLANT
GLOVE BIOGEL PI IND STRL 8 (GLOVE) ×1 IMPLANT
GLOVE BIOGEL PI INDICATOR 8 (GLOVE) ×1
GOWN STRL NON-REIN LRG LVL3 (GOWN DISPOSABLE) ×6 IMPLANT
GOWN STRL REUS W/TWL XL LVL3 (GOWN DISPOSABLE) ×4 IMPLANT
GUIDEWIRE STR DUAL SENSOR (WIRE) IMPLANT
IRRIG SUCT STRYKERFLOW 2 WTIP (MISCELLANEOUS)
IRRIGATION SUCT STRKRFLW 2 WTP (MISCELLANEOUS) IMPLANT
KIT BASIN OR (CUSTOM PROCEDURE TRAY) ×2 IMPLANT
NEEDLE INSUFFLATION 14GA 120MM (NEEDLE) ×2 IMPLANT
NS IRRIG 1000ML POUR BTL (IV SOLUTION) ×2 IMPLANT
PACK CYSTO (CUSTOM PROCEDURE TRAY) IMPLANT
POSITIONER SURGICAL ARM (MISCELLANEOUS) ×4 IMPLANT
SEAL CANN UNIV 5-8 DVNC XI (MISCELLANEOUS) ×4 IMPLANT
SEAL XI 5MM-8MM UNIVERSAL (MISCELLANEOUS) ×4
SHEET LAVH (DRAPES) IMPLANT
SOLUTION ELECTROLUBE (MISCELLANEOUS) ×2 IMPLANT
STENT CONTOUR 7FRX26X.038 (STENTS) ×2 IMPLANT
STENT CONTOUR 8FR X 28 (STENTS) ×2 IMPLANT
SUT ETHILON 3 0 PS 1 (SUTURE) ×2 IMPLANT
SUT MNCRL AB 4-0 PS2 18 (SUTURE) ×4 IMPLANT
SUT VIC AB 0 CT1 27 (SUTURE) ×3
SUT VIC AB 0 CT1 27XBRD ANTBC (SUTURE) ×3 IMPLANT
SUT VIC AB 0 UR5 27 (SUTURE) ×4 IMPLANT
SUT VIC AB 4-0 RB1 27 (SUTURE) ×4
SUT VIC AB 4-0 RB1 27XBRD (SUTURE) ×4 IMPLANT
SYR BULB IRRIGATION 50ML (SYRINGE) IMPLANT
TOWEL OR 17X26 10 PK STRL BLUE (TOWEL DISPOSABLE) ×2 IMPLANT
TOWEL OR NON WOVEN STRL DISP B (DISPOSABLE) ×4 IMPLANT
TRAY FOLEY W/METER SILVER 16FR (SET/KITS/TRAYS/PACK) ×2 IMPLANT
TRAY LAPAROSCOPIC (CUSTOM PROCEDURE TRAY) ×2 IMPLANT
TROCAR XCEL 12X100 BLDLESS (ENDOMECHANICALS) ×2 IMPLANT
TUBING CONNECTING 10 (TUBING) ×2 IMPLANT
TUBING INSUF HEATED (TUBING) ×2 IMPLANT
WATER STERILE IRR 1500ML POUR (IV SOLUTION) ×2 IMPLANT

## 2016-08-20 NOTE — Discharge Instructions (Signed)

## 2016-08-20 NOTE — H&P (Signed)
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 He has a lower poel crossing vessel.   Past Medical History:  Diagnosis Date  . Frequent urination at night   . Kidney stones    Past Surgical History:  Procedure Laterality Date  . WISDOM TOOTH EXTRACTION      Home Medications:  Prescriptions Prior to Admission  Medication Sig Dispense Refill Last Dose  . aspirin 325 MG tablet Take 325-650 mg by mouth 2 (two) times daily as needed (for pain).   Past Month at Unknown time  . ibuprofen (ADVIL,MOTRIN) 200 MG tablet Take 200-400 mg by mouth every 8 (eight) hours as needed (for pain.).   Past Month at Unknown time   Allergies: No Known Allergies  Family History  Problem Relation Age of Onset  . Hyperlipidemia Mother   . Hypertension Mother   . Cancer Father   . Hypertension Father    Social History:  reports that he has quit smoking. His smoking use included Cigarettes. He has a 10.00 pack-year smoking history. He has never used smokeless tobacco. He reports that he drinks alcohol. He reports that he does not use drugs.  Review of Systems  Genitourinary: Positive for flank pain and hematuria.  All other systems reviewed and are negative.   Physical Exam:  Vital signs in last 24 hours: Temp:  [97.9 F (36.6 C)] 97.9 F (36.6 C) (10/27 0952) Pulse Rate:  [57] 57 (10/27 0952) Resp:  [18] 18 (10/27 0952) BP: (140)/(79) 140/79 (10/27 0952) SpO2:  [100 %] 100 % (10/27 0952) Weight:  [114.8 kg (253 lb)] 114.8 kg (253 lb) (10/27 0954) Physical Exam  Constitutional: He is oriented to person, place, and time. He appears well-developed and well-nourished.  HENT:  Head: Normocephalic and atraumatic.  Eyes: EOM are normal. Pupils are equal, round, and reactive to light.  Neck: Normal range of motion. No thyromegaly  present.  Cardiovascular: Normal rate and regular rhythm.   Respiratory: Effort normal. No respiratory distress.  GI: Soft. He exhibits no distension.  Musculoskeletal: Normal range of motion. He exhibits no edema.  Neurological: He is alert and oriented to person, place, and time.  Skin: Skin is warm and dry.  Psychiatric: He has a normal mood and affect. His behavior is normal. Judgment and thought content normal.    Laboratory Data:  No results found for this or any previous visit (from the past 24 hour(s)). No results found for this or any previous visit (from the past 240 hour(s)). Creatinine: No results for input(s): CREATININE in the last 168 hours. 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      

## 2016-08-20 NOTE — Transfer of Care (Signed)
Immediate Anesthesia Transfer of Care Note  Patient: Elisabeth PigeonScott Boger  Procedure(s) Performed: Procedure(s): XI ROBOTIC ASSISTED URETEROLYSIS (Left) CYSTOSCOPY WITH LEFT RETROGRADE PYELOGRAM/URETERAL STENT PLACEMENT (Left)  Patient Location: PACU  Anesthesia Type:General  Level of Consciousness:  sedated, patient cooperative and responds to stimulation  Airway & Oxygen Therapy:Patient Spontanous Breathing and Patient connected to face mask oxgen  Post-op Assessment:  Report given to PACU RN and Post -op Vital signs reviewed and stable  Post vital signs:  Reviewed and stable  Last Vitals:  Vitals:   08/20/16 0952  BP: 140/79  Pulse: (!) 57  Resp: 18  Temp: 36.6 C    Complications: No apparent anesthesia complications

## 2016-08-20 NOTE — Brief Op Note (Signed)
08/20/2016  2:45 PM  PATIENT:  Jeremy Cummings  45 y.o. male  PRE-OPERATIVE DIAGNOSIS:  LEFT URETEROPELVIC JUNCTION OBSTRUCTION, NEPHROLITHIASIS  POST-OPERATIVE DIAGNOSIS:  LEFT URETEROPELVIC JUNCTION OBSTRUCTION,   PROCEDURE:  Procedure(s): XI ROBOTIC ASSISTED URETEROLYSIS (Left) CYSTOSCOPY WITH LEFT RETROGRADE PYELOGRAM/URETERAL STENT PLACEMENT (Left)  SURGEON:  Surgeon(s) and Role:    * Malen GauzePatrick L Zaylei Mullane, MD - Primary  PHYSICIAN ASSISTANT:   ASSISTANTS: none   ANESTHESIA:   general  EBL:  Total I/O In: -  Out: 210 [Urine:200; Blood:10]  BLOOD ADMINISTERED:none  DRAINS: left 7x26 JJ ureteral stent, 16 French Foley   LOCAL MEDICATIONS USED:  NONE  SPECIMEN:  No Specimen  DISPOSITION OF SPECIMEN:  N/A  COUNTS:  YES  TOURNIQUET:  * No tourniquets in log *  DICTATION: .Note written in EPIC  PLAN OF CARE: Admit to inpatient   PATIENT DISPOSITION:  PACU - hemodynamically stable.   Delay start of Pharmacological VTE agent (>24hrs) due to surgical blood loss or risk of bleeding: not applicable

## 2016-08-20 NOTE — Anesthesia Preprocedure Evaluation (Signed)
Anesthesia Evaluation  Patient identified by MRN, date of birth, ID band Patient awake    Reviewed: Allergy & Precautions, NPO status , Patient's Chart, lab work & pertinent test results  History of Anesthesia Complications Negative for: history of anesthetic complications  Airway Mallampati: II  TM Distance: >3 FB Neck ROM: Full    Dental no notable dental hx. (+) Dental Advisory Given   Pulmonary former smoker,    Pulmonary exam normal        Cardiovascular negative cardio ROS Normal cardiovascular exam     Neuro/Psych negative neurological ROS  negative psych ROS   GI/Hepatic negative GI ROS, Neg liver ROS,   Endo/Other  Morbid obesity  Renal/GU      Musculoskeletal   Abdominal   Peds  Hematology   Anesthesia Other Findings   Reproductive/Obstetrics                             Anesthesia Physical Anesthesia Plan  ASA: II  Anesthesia Plan: General   Post-op Pain Management:    Induction: Intravenous  Airway Management Planned: Oral ETT  Additional Equipment:   Intra-op Plan:   Post-operative Plan: Extubation in OR  Informed Consent: I have reviewed the patients History and Physical, chart, labs and discussed the procedure including the risks, benefits and alternatives for the proposed anesthesia with the patient or authorized representative who has indicated his/her understanding and acceptance.   Dental advisory given  Plan Discussed with: CRNA, Anesthesiologist and Surgeon  Anesthesia Plan Comments:         Anesthesia Quick Evaluation

## 2016-08-20 NOTE — Anesthesia Postprocedure Evaluation (Signed)
Anesthesia Post Note  Patient: Jeremy PigeonScott Mandala  Procedure(s) Performed: Procedure(s) (LRB): XI ROBOTIC ASSISTED URETEROLYSIS (Left) CYSTOSCOPY WITH LEFT RETROGRADE PYELOGRAM/URETERAL STENT PLACEMENT (Left)  Patient location during evaluation: PACU Anesthesia Type: General Level of consciousness: awake and alert Pain management: pain level controlled Vital Signs Assessment: post-procedure vital signs reviewed and stable Respiratory status: spontaneous breathing, nonlabored ventilation, respiratory function stable and patient connected to nasal cannula oxygen Cardiovascular status: blood pressure returned to baseline and stable Postop Assessment: no signs of nausea or vomiting Anesthetic complications: no    Last Vitals:  Vitals:   08/20/16 1615 08/20/16 1631  BP:  133/82  Pulse: 76 80  Resp: 17 16  Temp: 36.6 C 36.5 C    Last Pain:  Vitals:   08/20/16 1615  TempSrc:   PainSc: 4                  Helyn Schwan JENNETTE

## 2016-08-20 NOTE — Anesthesia Procedure Notes (Addendum)
Procedure Name: Intubation Date/Time: 08/20/2016 12:16 PM Performed by: Wynonia SoursWALKER, Jarin Cornfield L Pre-anesthesia Checklist: Patient identified, Emergency Drugs available, Suction available, Patient being monitored and Timeout performed Patient Re-evaluated:Patient Re-evaluated prior to inductionOxygen Delivery Method: Circle system utilized Preoxygenation: Pre-oxygenation with 100% oxygen Intubation Type: IV induction Ventilation: Oral airway inserted - appropriate to patient size and Two handed mask ventilation required Laryngoscope Size: Mac and 3 Grade View: Grade II Tube type: Oral Tube size: 8.0 mm Number of attempts: 1 Airway Equipment and Method: Stylet Placement Confirmation: ETT inserted through vocal cords under direct vision,  positive ETCO2,  CO2 detector and breath sounds checked- equal and bilateral Secured at: 23 cm Tube secured with: Tape Dental Injury: Teeth and Oropharynx as per pre-operative assessment

## 2016-08-21 LAB — HEMOGLOBIN AND HEMATOCRIT, BLOOD
HEMATOCRIT: 42.6 % (ref 39.0–52.0)
HEMOGLOBIN: 14.3 g/dL (ref 13.0–17.0)

## 2016-08-21 LAB — BASIC METABOLIC PANEL
Anion gap: 6 (ref 5–15)
BUN: 11 mg/dL (ref 6–20)
CHLORIDE: 103 mmol/L (ref 101–111)
CO2: 28 mmol/L (ref 22–32)
CREATININE: 1.03 mg/dL (ref 0.61–1.24)
Calcium: 9.1 mg/dL (ref 8.9–10.3)
GFR calc Af Amer: >60 mL/min (ref >60)
GFR calc non Af Amer: >60 mL/min (ref >60)
Glucose, Bld: 136 mg/dL — ABNORMAL HIGH (ref 65–99)
Potassium: 4 mmol/L (ref 3.5–5.1)
Sodium: 137 mmol/L (ref 135–145)

## 2016-08-21 MED ORDER — SENNOSIDES-DOCUSATE SODIUM 8.6-50 MG PO TABS
1.0000 | ORAL_TABLET | Freq: Two times a day (BID) | ORAL | Status: DC
  Administered 2016-08-21 – 2016-08-22 (×3): 1 via ORAL
  Filled 2016-08-21 (×3): qty 1

## 2016-08-21 NOTE — Progress Notes (Signed)
1 Day Post-Op  Subjective:  1 - Left Proximal Ureteral Obstruction - s/p cysto, left JJ stent and robotic left ureterolysis + non-dismembered pyeloplasty 08/20/16.  Today "Jeremy Cummings" is stable. Hgb stable, no fevers or emesis, tolerating clears, pain controlled.   Objective: Vital signs in last 24 hours: Temp:  [97.5 F (36.4 C)-98.3 F (36.8 C)] 98.3 F (36.8 C) (10/28 0510) Pulse Rate:  [54-80] 54 (10/28 0510) Resp:  [10-18] 16 (10/28 0510) BP: (103-156)/(53-82) 103/53 (10/28 0510) SpO2:  [97 %-100 %] 97 % (10/28 0510) Weight:  [114.8 kg (253 lb)] 114.8 kg (253 lb) (10/27 0954) Last BM Date: 08/20/16  Intake/Output from previous day: 10/27 0701 - 10/28 0700 In: 2973.8 [P.O.:480; I.V.:2493.8] Out: 2560 [Urine:2550; Blood:10] Intake/Output this shift: Total I/O In: 1873.8 [P.O.:480; I.V.:1393.8] Out: 2300 [Urine:2300]  General appearance: alert, cooperative and appears stated age Head: Normocephalic, without obvious abnormality, atraumatic Eyes: negative Nose: Nares normal. Septum midline. Mucosa normal. No drainage or sinus tenderness. Throat: lips, mucosa, and tongue normal; teeth and gums normal Neck: supple, symmetrical, trachea midline Back: symmetric, no curvature. ROM normal. No CVA tenderness. Resp: non-labored on minimal [place] O2.  Breasts: normal appearance, no masses or tenderness GI: soft, non-tender; bowel sounds normal; no masses,  no organomegaly Male genitalia: normal, foley in situ, light pink / clear urine, removed.  Extremities: extremities normal, atraumatic, no cyanosis or edema Skin: Skin color, texture, turgor normal. No rashes or lesions Lymph nodes: Cervical, supraclavicular, and axillary nodes normal. Neurologic: Grossly normal Incision/Wound: Recent port sites c/d/i. No hernias / hematomas.   Lab Results:   Recent Labs  08/20/16 1600 08/21/16 0535  HGB 14.4 14.3  HCT 41.1 42.6   BMET No results for input(s): NA, K, CL, CO2, GLUCOSE, BUN,  CREATININE, CALCIUM in the last 72 hours. PT/INR No results for input(s): LABPROT, INR in the last 72 hours. ABG No results for input(s): PHART, HCO3 in the last 72 hours.  Invalid input(s): PCO2, PO2  Studies/Results: Dg C-arm 1-60 Min-no Report  Result Date: 08/20/2016 CLINICAL DATA: intra op C-ARM 1-60 MINUTES Fluoroscopy was utilized by the requesting physician.  No radiographic interpretation.    Anti-infectives: Anti-infectives    Start     Dose/Rate Route Frequency Ordered Stop   08/20/16 0951  ceFAZolin (ANCEF) IVPB 2g/100 mL premix     2 g 200 mL/hr over 30 Minutes Intravenous 30 min pre-op 08/20/16 0951 08/20/16 1248      Assessment/Plan:  1 - Left Proximal Ureteral Obstruction - doing well POD 1. DC foley, saline lock, ambulate. Discussed goals for dischage, likely this afternoon v. Tomorrow given current progress.   Trinity MuscatineMANNY, Jeremy Cummings 08/21/2016

## 2016-08-21 NOTE — Progress Notes (Signed)
Assumed care of patient at . Agree with previous Nurse assessment.  Levern Kalka M. Lezli Danek, RN  

## 2016-08-22 MED ORDER — SENNOSIDES-DOCUSATE SODIUM 8.6-50 MG PO TABS: 1.0000 | ORAL_TABLET | Freq: Two times a day (BID) | ORAL | 0 refills | Status: DC

## 2016-08-22 NOTE — Discharge Summary (Signed)
Physician Discharge Summary  Patient ID: Jeremy PigeonScott Huesca MRN: [id number] DOB/AGE: May 25, 1971 45 y.o.  Admit date: 08/20/2016 Discharge date: 08/22/2016  Admission Diagnoses: Left Ureteral obstruction  Discharge Diagnoses:  Active Problems:   Ureteral obstruction   Discharged Condition: good  Hospital Course:   1 - Left Proximal Ureteral Obstruction - s/p cysto, left JJ stent and robotic left ureterolysis + non-dismembered pyeloplasty 08/20/16. Admitted to 4th floor Urology service post-op. Foley removed POD 1 and he began ambulating and tollerating PO intake. By the AM of POD 2 he is ambulatory, pain controlled with PO meds, tollerating PO intake, and felt to be adequate for discharge.     Consults: None  Significant Diagnostic Studies: labs: Hgb >10.  Treatments: surgery:  cysto, left JJ stent and robotic left ureterolysis + non-dismembered pyeloplasty 08/20/16  Discharge Exam: Blood pressure 112/66, pulse (!) 58, temperature 98.7 F (37.1 C), temperature source Oral, resp. rate 16, height 5\' 11"  (1.803 m), weight 114.8 kg (253 lb), SpO2 94 %. General appearance: alert, cooperative and appears stated age Head: Normocephalic, without obvious abnormality, atraumatic Eyes: negative Nose: Nares normal. Septum midline. Mucosa normal. No drainage or sinus tenderness. Throat: lips, mucosa, and tongue normal; teeth and gums normal Neck: supple, symmetrical, trachea midline Back: symmetric, no curvature. ROM normal. No CVA tenderness. Resp: non-labored on room air Cardio: Nl rate GI: soft, non-tender; bowel sounds normal; no masses,  no organomegaly Male genitalia: normal Extremities: extremities normal, atraumatic, no cyanosis or edema Skin: Skin color, texture, turgor normal. No rashes or lesions Lymph nodes: Cervical, supraclavicular, and axillary nodes normal. Neurologic: Grossly normal Incision/Wound: Recent port sites c/d/i. No hematomas or hernias.   Disposition:  Final discharge disposition not confirmed     Medication List    STOP taking these medications   aspirin 325 MG tablet   ibuprofen 200 MG tablet Commonly known as:  ADVIL,MOTRIN     TAKE these medications   HYDROcodone-acetaminophen 5-325 MG tablet Commonly known as:  NORCO Take 1-2 tablets by mouth every 6 (six) hours as needed for moderate pain or severe pain.   senna-docusate 8.6-50 MG tablet Commonly known as:  Senokot-S Take 1 tablet by mouth 2 (two) times daily. While taking pain meds to prevent constipation.      Follow-up Information    Jeremy AyePatrick McKenzie, MD Follow up on 08/27/2016.   Specialty:  Urology Why:  at 11:30 Contact information: [address] [place] [address] [phone]           Signed: Sebastian AcheMANNY, [name] 08/22/2016, 7:43 AM

## 2016-08-22 NOTE — Progress Notes (Signed)
Reviewed discharge information with patient. Answered all questions. Patient able to teach back medications and reasons to contact MD/911. Patient verbalizes importance of PCP follow up appointment.  Meredith Mells M. Darrell Leonhardt, RN   

## 2016-08-25 NOTE — Op Note (Signed)
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  Left robot assisted laparoscopic left ureterolysis 2. Cystoscopy 3. Left retrograde pyelography 4. Intraoperative fluoroscopy, under 1 hour, with interpretation 5. Placement of a left 6x26 JJ ureteral stent  Attending: Wilkie AyePatrick Exa Bomba  Assistant: Harrie ForemanAmanda Dancy, PA  Anesthesia: General  Estimated blood loss: 50 cc  Drains: 1. 16 French Foley catheter 2. Left 6x26 JJ ureteral stent  Specimens: None  Antibiotics: ancef  Findings: We did not identify a crossing vessel as the cause for the UPJ obstruction. There were dense adhesions and a kink in the ureter as it entered the renal pelvis.  Indications: Patient is a 45 year old male with a history of left UPJ obstruction who has failed conservative management.   After discussing treatment options patient decided to proceed with left robot assisted laparoscopic pyeloplasty.  Procedure in detail: Prior to procedure consent was obtained. Patient was brought to the operating room and briefing was done sure correct patient, correct procedure, correct site.  General anesthesia was administered patient was placed in dorsal lithotomy position.  Their genitalia was then prepped and draped in usual sterile fashion.  A rigid 22 French cystoscope was passed in the urethra and the bladder.  Bladder was inspected free masses or lesions.  the ureteral orifices were in the normal orthotopic locations.  a 6 french ureteral catheter was then instilled into the left ureteral orifice.  a gentle retrograde was obtained and findings noted above.  we then placed a zip wire through the ureteral catheter and advanced up to the renal pelvis.    We then placed a 6 x 26 double-j ureteral stent over the original zip wire.  We then removed the wire and good coil was noted in the the renal pelvis under fluoroscopy and the bladder  under direct vision.  A foley catheter was then placed and the patient was then repositioned in the right lateral decubitus position. their abdomen and flank was then prepped and draped usual sterile fashion.  A Veress needle was used to obtain pneumoperitoneum.  Once pneumoperitoneum was reestablished to 15 mmHg we then placed a 8 mm camera port lateral to the umbilicus at the lateral edge of rectus.  We then proceeded to place 3 more robotic ports. We then placed an assistant port. We then docked the robot.   We then dissected along the white line of Toldt.  We then reflected the colon medially.  We then identified the psoas muscle.  Once this was done we traced it down to the iliac vessels and identified the ureter.  Once we identified the gonadal vein and ureter were then traced this to the renal pelvis. We did not identify a crossing vessel as the cause for the UPJ obstruction. There were dense adhesions around the lower pole renal artery. We then freed the ureter from the fibrosis and we then mobilized the kidney laterally in order to ensure the ureter was off of tension. We then removed our instruments, undocked the robot, and released the pneumoperitoneum.  We then closed the midline port with a 0 Vicryl in a interrupted fashion. The remaining skin incisions were then subcuticularly closed with 4-0 Monocryl.  We then placed Dermabond over all the incisions.  This included the procedure which resulted by the patient.  Complications: None  Condition: Stable, x-rayed, transferred to PACU.  Plan: Patient is to be admitted for inpatient stay. The foley catheter will  be removed in 1 day. They will be started on a clear liquid diet POD#1. The stent will remain in place for 6 weeks

## 2016-08-27 ENCOUNTER — Ambulatory Visit (INDEPENDENT_AMBULATORY_CARE_PROVIDER_SITE_OTHER): Payer: Self-pay | Admitting: Urology

## 2016-08-27 DIAGNOSIS — Z9889 Other specified postprocedural states: Secondary | ICD-10-CM

## 2016-08-30 ENCOUNTER — Telehealth: Payer: Self-pay | Admitting: Radiology

## 2016-08-30 ENCOUNTER — Other Ambulatory Visit: Payer: Self-pay | Admitting: Radiology

## 2016-08-30 DIAGNOSIS — N200 Calculus of kidney: Secondary | ICD-10-CM

## 2016-08-30 NOTE — Telephone Encounter (Signed)
Notified pt of surgery with Dr Ronne BinningMcKenzie at [facility] on 09/15/16, [facility] on 09/10/16 @12 :45 & post op appt with Dr Ronne BinningMcKenzie at [place] office on 09/29/16 @3 :00. Pt voices understanding.

## 2016-09-09 NOTE — Patient Instructions (Signed)
Elisabeth PigeonScott Bostwick  09/09/2016     @PREFPERIOPPHARMACY @   Your procedure is scheduled on  09/15/2016   [facility] at  1030  A.M.  Call this number if you have problems the morning of surgery:  [phone]   Remember:  Do not eat food or drink liquids after midnight.  Take these medicines the morning of surgery with A SIP OF WATER  Hydrocodone.   Do not wear jewelry, make-up or nail polish.  Do not wear lotions, powders, or perfumes, or deoderant.  Do not shave 48 hours prior to surgery.  Men may shave face and neck.  Do not bring valuables to the hospital.  [facility] not responsible [facility] any belongings or valuables.  Contacts, dentures or bridgework may not be worn into surgery.  Leave your suitcase in the car.  After surgery it may be brought to your room.  For patients admitted to the hospital, discharge time will be determined by your treatment team.  Patients discharged the day of surgery will not be allowed to drive home.   Name and phone number of your driver:   family Special instructions:  none  Please read over the following fact sheets that you were given. Anesthesia Post-op Instructions and Care and Recovery After Surgery       Cystoscopy Cystoscopy is a procedure that is used to help diagnose and sometimes treat conditions that affect that lower urinary tract. The lower urinary tract includes the bladder and the tube that drains urine from the bladder out of the body (urethra). Cystoscopy is performed with a thin, tube-shaped instrument with a light and camera at the end (cystoscope). The cystoscope may be hard (rigid) or flexible, depending on the goal of the procedure.The cystoscope is inserted through the urethra, into the bladder. Cystoscopy may be recommended if you have:  Urinary tractinfections that keep coming back (recurring).  Blood in the urine (hematuria).  Loss of bladder control (urinary incontinence) or an  overactive bladder.  Unusual cells found in a urine sample.  A blockage in the urethra.  Painful urination.  An abnormality in the bladder found during an intravenous pyelogram (IVP) or CT scan. Cystoscopy may also be done to remove a sample of tissue to be examined under a microscope (biopsy). Tell a health care provider about:  Any allergies you have.  All medicines you are taking, including vitamins, herbs, eye drops, creams, and over-the-counter medicines.  Any problems you or family members have had with anesthetic medicines.  Any blood disorders you have.  Any surgeries you have had.  Any medical conditions you have.  Whether you are pregnant or may be pregnant. What are the risks? Generally, this is a safe procedure. However, problems may occur, including:  Infection.  Bleeding.  Allergic reactions to medicines.  Damage to other structures or organs. What happens before the procedure?  Ask your health care provider about:  Changing or stopping your regular medicines. This is especially important if you are taking diabetes medicines or blood thinners.  Taking medicines such as aspirin and ibuprofen. These medicines can thin your blood. Do not take these medicines before your procedure if your health care provider instructs you not to.  Follow instructions from your health care provider about eating or drinking restrictions.  You may be given antibiotic medicine to help prevent infection.  You may have an exam or testing, such as X-rays of the bladder, urethra,  or kidneys.  You may have urine tests to check for signs of infection.  Plan to have someone take you home after the procedure. What happens during the procedure?  To reduce your risk of infection,your health care team will wash or sanitize their hands.  You will be given one or more of the following:  A medicine to help you relax (sedative).  A medicine to numb the area (local  anesthetic).  The area around the opening of your urethra will be cleaned.  The cystoscope will be passed through your urethra into your bladder.  Germ-free (sterile)fluid will flow through the cystoscope to fill your bladder. The fluid will stretch your bladder so that your surgeon can clearly examine your bladder walls.  The cystoscope will be removed and your bladder will be emptied. The procedure may vary among health care providers and hospitals. What happens after the procedure?  You may have some soreness or pain in your abdomen and urethra. Medicines will be available to help you.  You may have some blood in your urine.  Do not drive for 24 hours if you received a sedative. This information is not intended to replace advice given to you by your health care provider. Make sure you discuss any questions you have with your health care provider. Document Released: 10/08/2000 Document Revised: 02/19/2016 Document Reviewed: 08/28/2015 Elsevier Interactive Patient Education  2017 [employer] Cystoscopy, Care After Refer to this sheet in the next few weeks. These instructions provide you with information about caring for yourself after your procedure. Your health care provider may also give you more specific instructions. Your treatment has been planned according to current medical practices, but problems sometimes occur. Call your health care provider if you have any problems or questions after your procedure. What can I expect after the procedure? After the procedure, it is common to have:  Mild pain when you urinate. Pain should stop within a few minutes after you urinate. This may last for up to 1 week.  A small amount of blood in your urine for several days.  Feeling like you need to urinate but producing only a small amount of urine. Follow these instructions at home:   Medicines  Take over-the-counter and prescription medicines only as told by your health care  provider.  If you were prescribed an antibiotic medicine, take it as told by your health care provider. Do not stop taking the antibiotic even if you start to feel better. General instructions  Return to your normal activities as told by your health care provider. Ask your health care provider what activities are safe for you.  Do not drive for 24 hours if you received a sedative.  Watch for any blood in your urine. If the amount of blood in your urine increases, call your health care provider.  Follow instructions from your health care provider about eating or drinking restrictions.  If a tissue sample was removed for testing (biopsy) during your procedure, it is your responsibility to get your test results. Ask your health care provider or the department performing the test when your results will be ready.  Drink enough fluid to keep your urine clear or pale yellow.  Keep all follow-up visits as told by your health care provider. This is important. Contact a health care provider if:  You have pain that gets worse or does not get better with medicine, especially pain when you urinate.  You have difficulty urinating. Get help right away if:  You have more blood in your urine.  You have blood clots in your urine.  You have abdominal pain.  You have a fever or chills.  You are unable to urinate. This information is not intended to replace advice given to you by your health care provider. Make sure you discuss any questions you have with your health care provider. Document Released: 04/30/2005 Document Revised: 03/18/2016 Document Reviewed: 08/28/2015 Elsevier Interactive Patient Education  2017 [employer]vier [employer]. PATIENT INSTRUCTIONS POST-ANESTHESIA  IMMEDIATELY FOLLOWING SURGERY:  Do not drive or operate machinery for the first twenty four hours after surgery.  Do not make any important decisions for twenty four hours after surgery or while taking narcotic pain medications or  sedatives.  If you develop intractable nausea and vomiting or a severe headache please notify your doctor immediately.  FOLLOW-UP:  Please make an appointment with your surgeon as instructed. You do not need to follow up with anesthesia unless specifically instructed to do so.  WOUND CARE INSTRUCTIONS (if applicable):  Keep a dry clean dressing on the anesthesia/puncture wound site if there is drainage.  Once the wound has quit draining you may leave it open to air.  Generally you should leave the bandage intact for twenty four hours unless there is drainage.  If the epidural site drains for more than 36-48 hours please call the anesthesia department.  QUESTIONS?:  Please feel free to call your physician or the hospital operator if you have any questions, and they will be happy to assist you.

## 2016-09-10 ENCOUNTER — Encounter (HOSPITAL_COMMUNITY): Payer: Self-pay

## 2016-09-10 ENCOUNTER — Encounter (HOSPITAL_COMMUNITY)
Admission: RE | Admit: 2016-09-10 | Discharge: 2016-09-10 | Disposition: A | Discharge status: Home / Self Care | Payer: BLUE CROSS/BLUE SHIELD | Source: Ambulatory Visit | Attending: Urology | Admitting: Urology

## 2016-09-10 DIAGNOSIS — Z01818 Encounter for other preprocedural examination: Secondary | ICD-10-CM | POA: Insufficient documentation

## 2016-09-10 DIAGNOSIS — N200 Calculus of kidney: Secondary | ICD-10-CM | POA: Insufficient documentation

## 2016-09-10 HISTORY — DX: Personal history of urinary calculi: Z87.442

## 2016-09-15 ENCOUNTER — Ambulatory Visit (HOSPITAL_COMMUNITY)
Admission: RE | Admit: 2016-09-15 | Discharge: 2016-09-15 | Disposition: A | Discharge status: Home / Self Care | Payer: BLUE CROSS/BLUE SHIELD | Source: Ambulatory Visit | Attending: Urology | Admitting: Urology

## 2016-09-15 ENCOUNTER — Ambulatory Visit (HOSPITAL_COMMUNITY): Payer: BLUE CROSS/BLUE SHIELD | Admitting: Anesthesiology

## 2016-09-15 ENCOUNTER — Encounter (HOSPITAL_COMMUNITY)
Admission: RE | Disposition: A | Discharge status: Home / Self Care | Payer: Self-pay | Source: Ambulatory Visit | Attending: Urology

## 2016-09-15 ENCOUNTER — Encounter (HOSPITAL_COMMUNITY): Payer: Self-pay | Admitting: *Deleted

## 2016-09-15 ENCOUNTER — Ambulatory Visit (HOSPITAL_COMMUNITY): Payer: BLUE CROSS/BLUE SHIELD

## 2016-09-15 DIAGNOSIS — N200 Calculus of kidney: Secondary | ICD-10-CM | POA: Diagnosis not present

## 2016-09-15 DIAGNOSIS — Z87891 Personal history of nicotine dependence: Secondary | ICD-10-CM | POA: Insufficient documentation

## 2016-09-15 HISTORY — PX: CYSTOSCOPY WITH HOLMIUM LASER LITHOTRIPSY: SHX6639

## 2016-09-15 HISTORY — PX: CYSTOSCOPY WITH RETROGRADE PYELOGRAM, URETEROSCOPY AND STENT PLACEMENT: SHX5789

## 2016-09-15 SURGERY — CYSTOURETEROSCOPY, WITH RETROGRADE PYELOGRAM AND STENT INSERTION
Anesthesia: General | Laterality: Left

## 2016-09-15 MED ORDER — LIDOCAINE HCL (CARDIAC) 10 MG/ML IV SOLN
INTRAVENOUS | Status: DC | PRN
  Administered 2016-09-15: 50 mg via INTRAVENOUS

## 2016-09-15 MED ORDER — FENTANYL CITRATE (PF) 100 MCG/2ML IJ SOLN
INTRAMUSCULAR | Status: AC
  Filled 2016-09-15: qty 2

## 2016-09-15 MED ORDER — ONDANSETRON HCL 4 MG/2ML IJ SOLN
INTRAMUSCULAR | Status: AC
  Filled 2016-09-15: qty 2

## 2016-09-15 MED ORDER — ONDANSETRON HCL 4 MG/2ML IJ SOLN
4.0000 mg | Freq: Once | INTRAMUSCULAR | Status: AC
  Administered 2016-09-15: 4 mg via INTRAVENOUS

## 2016-09-15 MED ORDER — DIATRIZOATE MEGLUMINE 30 % UR SOLN
URETHRAL | Status: DC | PRN
  Administered 2016-09-15: 100 mL

## 2016-09-15 MED ORDER — SODIUM CHLORIDE 0.9 % IR SOLN
Status: DC | PRN
  Administered 2016-09-15 (×2): 3000 mL

## 2016-09-15 MED ORDER — MIDAZOLAM HCL 2 MG/2ML IJ SOLN
1.0000 mg | INTRAMUSCULAR | Status: DC | PRN
  Administered 2016-09-15: 2 mg via INTRAVENOUS

## 2016-09-15 MED ORDER — DEXTROSE 5 % IV SOLN
2.0000 g | INTRAVENOUS | Status: AC
  Administered 2016-09-15: 2 g via INTRAVENOUS
  Filled 2016-09-15: qty 2

## 2016-09-15 MED ORDER — SULFAMETHOXAZOLE-TRIMETHOPRIM 800-160 MG PO TABS: 1.0000 | ORAL_TABLET | Freq: Two times a day (BID) | ORAL | 0 refills | Status: AC

## 2016-09-15 MED ORDER — MIDAZOLAM HCL 2 MG/2ML IJ SOLN
INTRAMUSCULAR | Status: AC
  Filled 2016-09-15: qty 2

## 2016-09-15 MED ORDER — PROPOFOL 10 MG/ML IV BOLUS
INTRAVENOUS | Status: DC | PRN
  Administered 2016-09-15: 150 mg via INTRAVENOUS
  Administered 2016-09-15: 30 mg via INTRAVENOUS
  Administered 2016-09-15: 20 mg via INTRAVENOUS

## 2016-09-15 MED ORDER — LIDOCAINE HCL (PF) 1 % IJ SOLN
INTRAMUSCULAR | Status: AC
  Filled 2016-09-15: qty 5

## 2016-09-15 MED ORDER — FENTANYL CITRATE (PF) 100 MCG/2ML IJ SOLN
25.0000 ug | INTRAMUSCULAR | Status: AC | PRN
  Administered 2016-09-15 (×2): 25 ug via INTRAVENOUS

## 2016-09-15 MED ORDER — GLYCOPYRROLATE 0.2 MG/ML IJ SOLN
INTRAMUSCULAR | Status: AC
  Filled 2016-09-15: qty 1

## 2016-09-15 MED ORDER — HYDROCODONE-ACETAMINOPHEN 5-325 MG PO TABS: 1.0000 | ORAL_TABLET | Freq: Four times a day (QID) | ORAL | 0 refills | Status: AC | PRN

## 2016-09-15 MED ORDER — GLYCOPYRROLATE 0.2 MG/ML IJ SOLN
INTRAMUSCULAR | Status: DC | PRN
  Administered 2016-09-15: 0.2 mg via INTRAVENOUS

## 2016-09-15 MED ORDER — FENTANYL CITRATE (PF) 100 MCG/2ML IJ SOLN
INTRAMUSCULAR | Status: DC | PRN
  Administered 2016-09-15: 12.5 ug via INTRAVENOUS
  Administered 2016-09-15: 25 ug via INTRAVENOUS
  Administered 2016-09-15: 12.5 ug via INTRAVENOUS
  Administered 2016-09-15 (×2): 25 ug via INTRAVENOUS

## 2016-09-15 MED ORDER — STERILE WATER FOR IRRIGATION IR SOLN
Status: DC | PRN
  Administered 2016-09-15: 3000 mL

## 2016-09-15 MED ORDER — FENTANYL CITRATE (PF) 100 MCG/2ML IJ SOLN
25.0000 ug | INTRAMUSCULAR | Status: DC | PRN
  Administered 2016-09-15: 50 ug via INTRAVENOUS
  Administered 2016-09-15: 25 ug via INTRAVENOUS
  Administered 2016-09-15: 50 ug via INTRAVENOUS
  Administered 2016-09-15: 25 ug via INTRAVENOUS

## 2016-09-15 MED ORDER — DIATRIZOATE MEGLUMINE 30 % UR SOLN
URETHRAL | Status: AC
  Filled 2016-09-15: qty 300

## 2016-09-15 MED ORDER — LACTATED RINGERS IV SOLN
INTRAVENOUS | Status: DC
  Administered 2016-09-15 (×2): via INTRAVENOUS

## 2016-09-15 MED ORDER — SULFAMETHOXAZOLE-TRIMETHOPRIM 800-160 MG PO TABS: 1.0000 | ORAL_TABLET | Freq: Two times a day (BID) | ORAL | 0 refills | Status: DC

## 2016-09-15 SURGICAL SUPPLY — 31 items
BAG HAMPER (MISCELLANEOUS) ×3 IMPLANT
BAG URO CATCHER STRL LF (MISCELLANEOUS) ×3 IMPLANT
BASKET LASER NITINOL 1.9FR (BASKET) IMPLANT
BASKET ZERO TIP NITINOL 2.4FR (BASKET) IMPLANT
CATH INTERMIT  6FR 70CM (CATHETERS) IMPLANT
CLOTH BEACON ORANGE TIMEOUT ST (SAFETY) ×3 IMPLANT
EXTRACTOR STONE NITINOL NGAGE (UROLOGICAL SUPPLIES) ×3 IMPLANT
FIBER LASER FLEXIVA 200 (UROLOGICAL SUPPLIES) ×6 IMPLANT
FIBER LASER FLEXIVA 365 (UROLOGICAL SUPPLIES) IMPLANT
GLOVE BIO SURGEON STRL SZ8 (GLOVE) ×3 IMPLANT
GLOVE EXAM NITRILE MD LF STRL (GLOVE) ×3 IMPLANT
GLOVE SURG SS PI 6.5 STRL IVOR (GLOVE) ×3 IMPLANT
GOWN STRL REUS W/ TWL LRG LVL3 (GOWN DISPOSABLE) ×1 IMPLANT
GOWN STRL REUS W/ TWL XL LVL3 (GOWN DISPOSABLE) ×1 IMPLANT
GOWN STRL REUS W/TWL LRG LVL3 (GOWN DISPOSABLE) ×2
GOWN STRL REUS W/TWL XL LVL3 (GOWN DISPOSABLE) ×2
GUIDEWIRE ANG ZIPWIRE 038X150 (WIRE) ×3 IMPLANT
GUIDEWIRE STR DUAL SENSOR (WIRE) IMPLANT
GUIDEWIRE STR ZIPWIRE 035X150 (MISCELLANEOUS) ×3 IMPLANT
IV NS IRRIG 3000ML ARTHROMATIC (IV SOLUTION) ×6 IMPLANT
LASER FIBER DISP (UROLOGICAL SUPPLIES) IMPLANT
MANIFOLD NEPTUNE II (INSTRUMENTS) ×3 IMPLANT
PACK CYSTO (CUSTOM PROCEDURE TRAY) ×3 IMPLANT
PAD ARMBOARD 7.5X6 YLW CONV (MISCELLANEOUS) ×3 IMPLANT
SHEATH ACCESS URETERAL 38CM (SHEATH) ×3 IMPLANT
STENT URET 6FRX26 CONTOUR (STENTS) ×3 IMPLANT
SYRINGE 10CC LL (SYRINGE) ×3 IMPLANT
TAPE CLOTH SURG 4X10 WHT LF (GAUZE/BANDAGES/DRESSINGS) ×3 IMPLANT
TOWEL OR 17X26 4PK STRL BLUE (TOWEL DISPOSABLE) ×3 IMPLANT
TUBE FEEDING 8FR 16IN STR KANG (MISCELLANEOUS) IMPLANT
WATER STERILE IRR 3000ML UROMA (IV SOLUTION) ×3 IMPLANT

## 2016-09-15 NOTE — Anesthesia Postprocedure Evaluation (Signed)
Anesthesia Post Note  Patient: Jeremy PigeonScott Cummings  Procedure(s) Performed: Procedure(s) (LRB): CYSTOSCOPY WITH RETROGRADE PYELOGRAM, URETEROSCOPY AND STENT EXCHANGE (Left) CYSTOSCOPY WITH HOLMIUM LASER LITHOTRIPSY (Left)  Patient location during evaluation: PACU Anesthesia Type: General Level of consciousness: awake and alert and oriented Pain management: pain level controlled Vital Signs Assessment: post-procedure vital signs reviewed and stable Respiratory status: spontaneous breathing Cardiovascular status: blood pressure returned to baseline Postop Assessment: adequate PO intake Anesthetic complications: no    Last Vitals:  Vitals:   09/15/16 1445 09/15/16 1450  BP: (!) 140/95   Pulse: (!) 55 (!) 50  Resp: 13   Temp:      Last Pain:  Vitals:   09/15/16 1450  PainSc: 5                  Limmie Schoenberg

## 2016-09-15 NOTE — Anesthesia Procedure Notes (Signed)
Procedure Name: LMA Insertion Date/Time: 09/15/2016 12:35 PM Performed by: Franco NonesYATES, Alexy Heldt S Pre-anesthesia Checklist: Patient identified, Patient being monitored, Emergency Drugs available, Timeout performed and Suction available Patient Re-evaluated:Patient Re-evaluated prior to inductionOxygen Delivery Method: Circle System Utilized Preoxygenation: Pre-oxygenation with 100% oxygen Intubation Type: IV induction Ventilation: Mask ventilation without difficulty LMA: LMA inserted LMA Size: 4.0 Number of attempts: 1 Placement Confirmation: positive ETCO2 and breath sounds checked- equal and bilateral

## 2016-09-15 NOTE — Interval H&P Note (Signed)
History and Physical Interval Note:  09/15/2016 12:23 PM  Jeremy Cummings  has presented today for surgery, with the diagnosis of left renal calculi  The various methods of treatment have been discussed with the patient and family. After consideration of risks, benefits and other options for treatment, the patient has consented to  Procedure(s): CYSTOSCOPY WITH RETROGRADE PYELOGRAM, URETEROSCOPY AND STENT EXCHANGE (Left) CYSTOSCOPY WITH HOLMIUM LASER LITHOTRIPSY (Left) as a surgical intervention .  The patient's history has been reviewed, patient examined, no change in status, stable for surgery.  I have reviewed the patient's chart and labs.  Questions were answered to the patient's satisfaction.     Wilkie AyePatrick McKenzie

## 2016-09-15 NOTE — Transfer of Care (Signed)
Immediate Anesthesia Transfer of Care Note  Patient: Jeremy Cummings  Procedure(s) Performed: Procedure(s): CYSTOSCOPY WITH RETROGRADE PYELOGRAM, URETEROSCOPY AND STENT EXCHANGE (Left) CYSTOSCOPY WITH HOLMIUM LASER LITHOTRIPSY (Left)  Patient Location: PACU  Anesthesia Type:General  Level of Consciousness: awake and alert   Airway & Oxygen Therapy: Patient Spontanous Breathing and Patient connected to face mask oxygen  Post-op Assessment: Report given to RN  Post vital signs: Reviewed and stable  Last Vitals:  Vitals:   09/15/16 1430  BP: 132/77  Pulse: 63  Temp: (P) 36.7 C    Last Pain: There were no vitals filed for this visit.       Complications: No apparent anesthesia complications

## 2016-09-15 NOTE — Anesthesia Preprocedure Evaluation (Signed)
Anesthesia Evaluation  Patient identified by MRN, date of birth, ID band Patient awake    Reviewed: Allergy & Precautions, NPO status , Patient's Chart, lab work & pertinent test results  History of Anesthesia Complications Negative for: history of anesthetic complications  Airway Mallampati: II  TM Distance: >3 FB Neck ROM: Full    Dental no notable dental hx. (+) Dental Advisory Given, Teeth Intact   Pulmonary former smoker,    Pulmonary exam normal        Cardiovascular negative cardio ROS Normal cardiovascular exam     Neuro/Psych negative neurological ROS  negative psych ROS   GI/Hepatic negative GI ROS, Neg liver ROS,   Endo/Other    Renal/GU Renal disease (stones)     Musculoskeletal   Abdominal   Peds  Hematology   Anesthesia Other Findings   Reproductive/Obstetrics                             Anesthesia Physical Anesthesia Plan  ASA: II  Anesthesia Plan: General   Post-op Pain Management:    Induction: Intravenous  Airway Management Planned: LMA  Additional Equipment:   Intra-op Plan:   Post-operative Plan: Extubation in OR  Informed Consent: I have reviewed the patients History and Physical, chart, labs and discussed the procedure including the risks, benefits and alternatives for the proposed anesthesia with the patient or authorized representative who has indicated his/her understanding and acceptance.   Dental advisory given  Plan Discussed with: CRNA, Anesthesiologist and Surgeon  Anesthesia Plan Comments:         Anesthesia Quick Evaluation

## 2016-09-15 NOTE — Progress Notes (Signed)
String from penis in place.

## 2016-09-15 NOTE — H&P (View-Only) (Signed)
Urology Admission H&P  Chief Complaint: left flank pain  History of Present Illness: Mr Jeremy Cummings is a 45yo with a hx of gross hematuria who was diagnosed with a left UPJ obstruction with multiple renal calculi. He has a lower poel crossing vessel.   Past Medical History:  Diagnosis Date  . Frequent urination at night   . Kidney stones    Past Surgical History:  Procedure Laterality Date  . WISDOM TOOTH EXTRACTION      Home Medications:  Prescriptions Prior to Admission  Medication Sig Dispense Refill Last Dose  . aspirin 325 MG tablet Take 325-650 mg by mouth 2 (two) times daily as needed (for pain).   Past Month at Unknown time  . ibuprofen (ADVIL,MOTRIN) 200 MG tablet Take 200-400 mg by mouth every 8 (eight) hours as needed (for pain.).   Past Month at Unknown time   Allergies: No Known Allergies  Family History  Problem Relation Age of Onset  . Hyperlipidemia Mother   . Hypertension Mother   . Cancer Father   . Hypertension Father    Social History:  reports that he has quit smoking. His smoking use included Cigarettes. He has a 10.00 pack-year smoking history. He has never used smokeless tobacco. He reports that he drinks alcohol. He reports that he does not use drugs.  Review of Systems  Genitourinary: Positive for flank pain and hematuria.  All other systems reviewed and are negative.   Physical Exam:  Vital signs in last 24 hours: Temp:  [97.9 F (36.6 C)] 97.9 F (36.6 C) (10/27 0952) Pulse Rate:  [57] 57 (10/27 0952) Resp:  [18] 18 (10/27 0952) BP: (140)/(79) 140/79 (10/27 0952) SpO2:  [100 %] 100 % (10/27 0952) Weight:  [114.8 kg (253 lb)] 114.8 kg (253 lb) (10/27 0954) Physical Exam  Constitutional: He is oriented to person, place, and time. He appears well-developed and well-nourished.  HENT:  Head: Normocephalic and atraumatic.  Eyes: EOM are normal. Pupils are equal, round, and reactive to light.  Neck: Normal range of motion. No thyromegaly  present.  Cardiovascular: Normal rate and regular rhythm.   Respiratory: Effort normal. No respiratory distress.  GI: Soft. He exhibits no distension.  Musculoskeletal: Normal range of motion. He exhibits no edema.  Neurological: He is alert and oriented to person, place, and time.  Skin: Skin is warm and dry.  Psychiatric: He has a normal mood and affect. His behavior is normal. Judgment and thought content normal.    Laboratory Data:  No results found for this or any previous visit (from the past 24 hour(s)). No results found for this or any previous visit (from the past 240 hour(s)). Creatinine: No results for input(s): CREATININE in the last 168 hours. Baseline Creatinine: unknwon  Impression/Assessment:  45yo with left UPJ obstruction and multiple left renal calculi  Plan:  The risks/benefits/alternatives to Left pyeloplasty and removal of the left renal calculi was explained to the patient and he understands and wishes to proceed with surgery  Wilkie AyePatrick Ahnesti Townsend 08/20/2016, 11:03 AM

## 2016-09-21 ENCOUNTER — Encounter (HOSPITAL_COMMUNITY): Payer: Self-pay | Admitting: Urology

## 2016-09-23 NOTE — Op Note (Signed)
Preoperative diagnosis: left renal calculi  Postoperative diagnosis: Same  Procedure: 1 cystoscopy 2.  Left retrograde pyelography 3.  Intraoperative fluoroscopy, under one hour, with interpretation 4.  left ureteroscopic stone manipulation with laser lithotripsy 5.  left 6 x 26 JJ stent exchange  Attending: Cleda MccreedyPatrick Mackenzie  Anesthesia: General  Estimated blood loss: None  Drains: left 6 x 26 JJ ureteral stent with tether  Specimens: stone for analysis  Antibiotics: rocephin  Findings: 100mm and 9mm left lower pole calculus No hydronephrosis. No masses/lesions in the bladder. Ureteral orifices in normal anatomic location.  Indications: Patient is a 45 year old male with a history of left renal calculi and left flank pain. He is status post left stent placement. After discussing treatment options, they decided proceed with left ureteroscopic stone manipulation.  Procedure her in detail: The patient was brought to the operating room and a brief timeout was done to ensure correct patient, correct procedure, correct site.  General anesthesia was administered patient was placed in dorsal lithotomy position.  Her genitalia was then prepped and draped in usual sterile fashion.  A rigid 22 French cystoscope was passed in the urethra and the bladder.  Bladder was inspected free masses or lesions.  the ureteral orifices were in the normal orthotopic locations. Using a grasper the left ureteral stent was brought to the urethral meatus. A zipwire was advanced through the stent and up to the renal pelvis. The stent was then removed. a 6 french ureteral catheter was then instilled into the left ureteral orifice.  a gentle retrograde was obtained and findings noted above.  we then removed the cystoscope and cannulated the left ureteral orifice with a semirigid ureteroscope.  We located no stone in the ureter. We then placed a sensor wire up to the renal pelvis. We removed the scope and  advanced a 12/14 x 38cm access sheath up to the renal pelvis. We then used the flexible ureteroscope to perform nephroscopy. We located two calculi in the lower pole. Using a 200nm laser fiber the stone was fragmented and the fragemnts were removed with an NGage basket. Once the stone were removed we then removed the access sheath under direct vision and noted to injury to the ureter.  We then placed a 6 x 26 double-j ureteral stent over the original zip wire. We then removed the wire and good coil was noted in the the renal pelvis under fluoroscopy and the bladder under direct vision.  the bladder was then drained and this concluded the procedure which was well tolerated by patient.  Complications: None  Condition: Stable, extubated, transferred to PACU  Plan: Patient is to be discharged home as to follow-up in 2 weeks. He is to remove his stent by pulling the tether in 72 hours

## 2016-09-28 LAB — STONE ANALYSIS
CA OXALATE, MONOHYDR.: 95 %
CA PHOS CRY STONE QL IR: 5 %
STONE WEIGHT KSTONE: 203.6 mg

## 2016-09-29 ENCOUNTER — Ambulatory Visit (INDEPENDENT_AMBULATORY_CARE_PROVIDER_SITE_OTHER): Payer: Self-pay | Admitting: Urology

## 2016-09-29 DIAGNOSIS — N200 Calculus of kidney: Secondary | ICD-10-CM

## 2016-11-19 ENCOUNTER — Other Ambulatory Visit: Payer: Self-pay | Admitting: Urology

## 2016-11-19 DIAGNOSIS — N200 Calculus of kidney: Secondary | ICD-10-CM

## 2017-04-06 ENCOUNTER — Ambulatory Visit: Payer: BLUE CROSS/BLUE SHIELD | Admitting: Urology

## 2017-04-11 ENCOUNTER — Ambulatory Visit (HOSPITAL_COMMUNITY): Payer: BLUE CROSS/BLUE SHIELD

## 2017-11-04 IMAGING — CT CT ABD-PEL WO/W CM
2 of 10 series · 10 of 46 positions shown, 16 images · IV contrast (APPLIED)
Comparison: None.

CLINICAL DATA: Hematuria for 2 months.  Left flank pain.

EXAM:
CT ABDOMEN AND PELVIS WITHOUT AND WITH CONTRAST
TECHNIQUE: Multidetector CT imaging of the abdomen and pelvis was performed
following the standard protocol before and following the bolus
administration of intravenous contrast.
CONTRAST:  100mL ZHLSYL-NBB IOPAMIDOL (ZHLSYL-NBB) INJECTION 61%

[Series 10: coronal · coronal · 0.89mm/px · 2 of 91 slices shown, 3 images]
[im 31/91  soft-tissue]
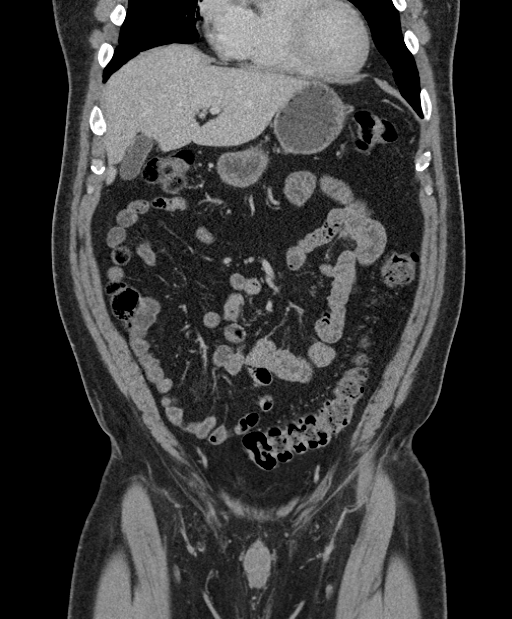
[im 31/91  bone]
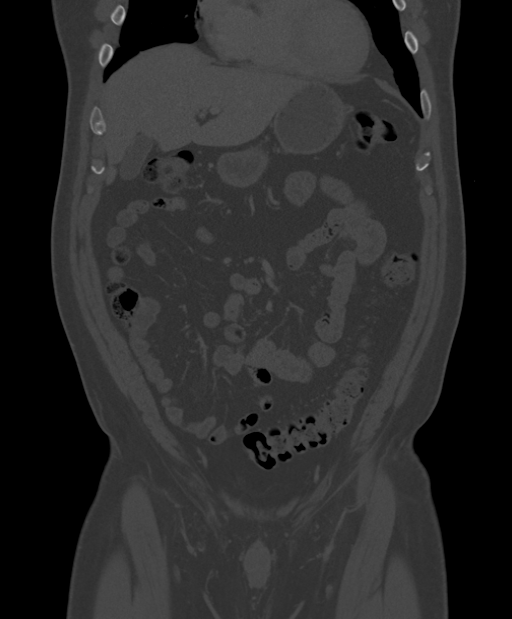
[im 61/91  soft-tissue]
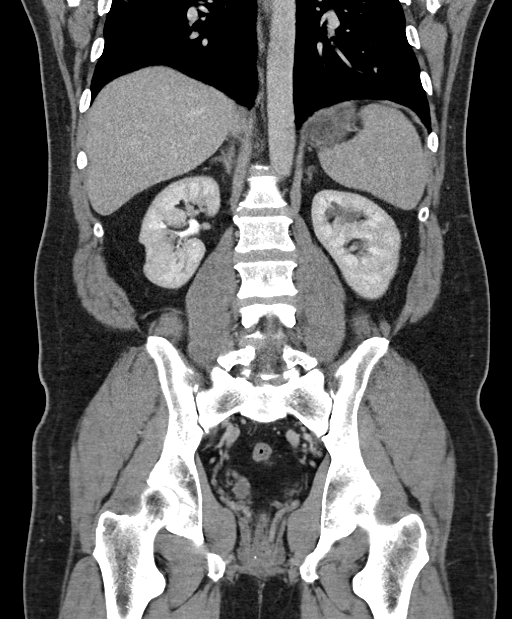

[Series 12: delays 5.0 i30f 1 · axial · 0.96mm/px · z∈[+747,+1202]mm · 8 of 119 slices shown, 13 images]
[im 14/119  soft-tissue]
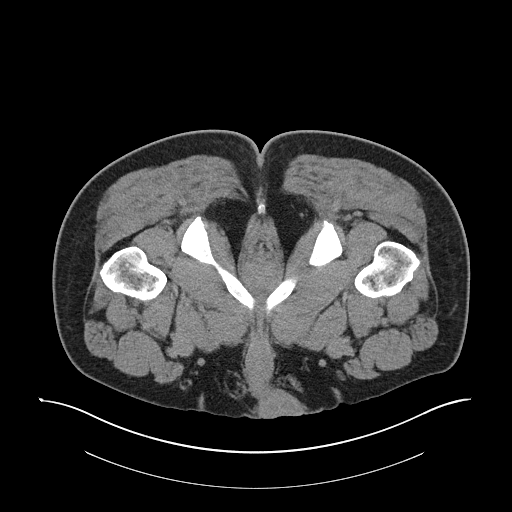
[im 14/119  bone]
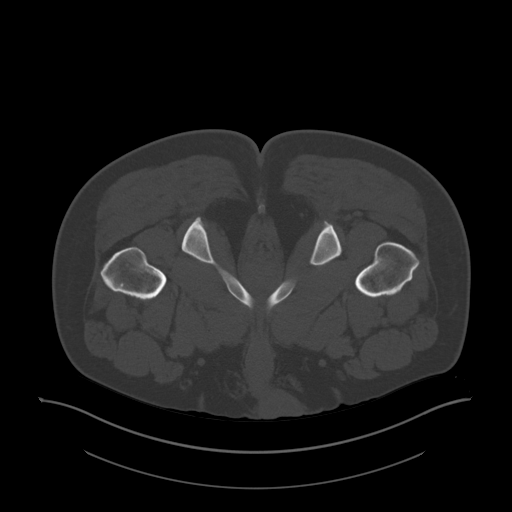
[im 27/119  soft-tissue]
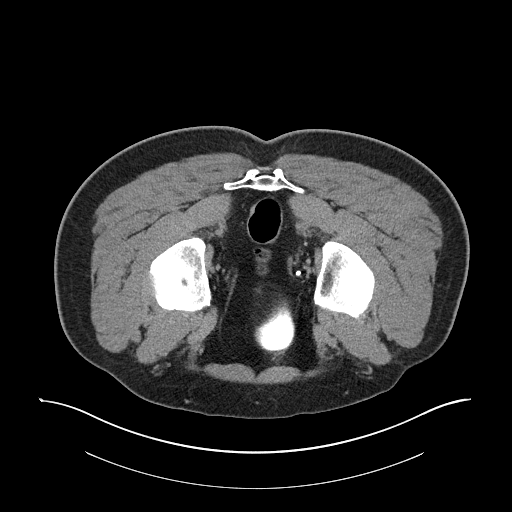
[im 40/119  soft-tissue]
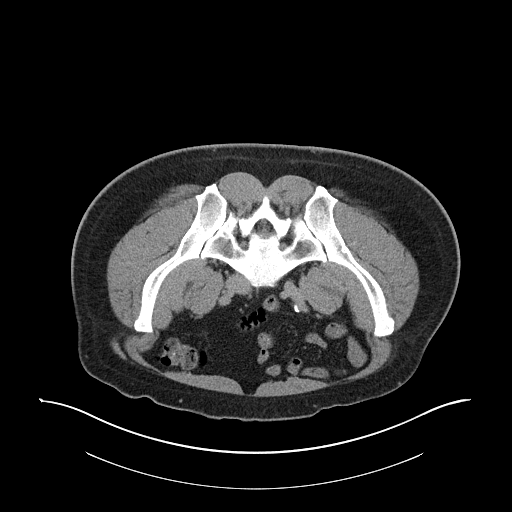
[im 53/119  soft-tissue]
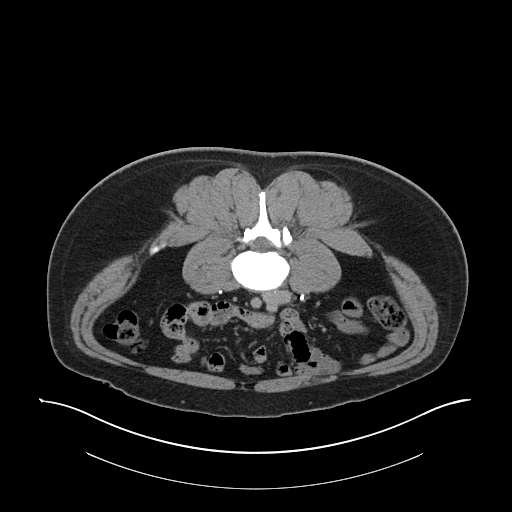
[im 66/119  soft-tissue]
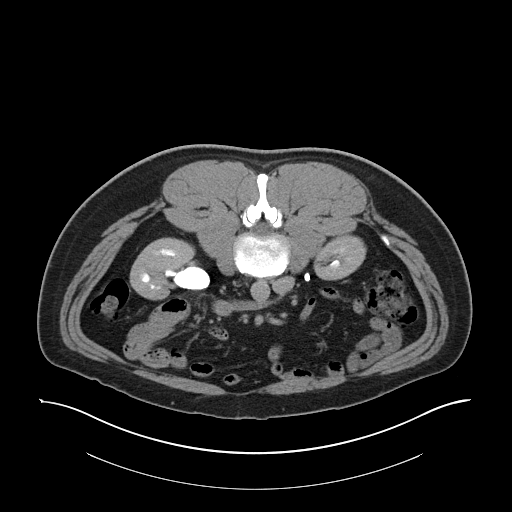
[im 66/119  lung]
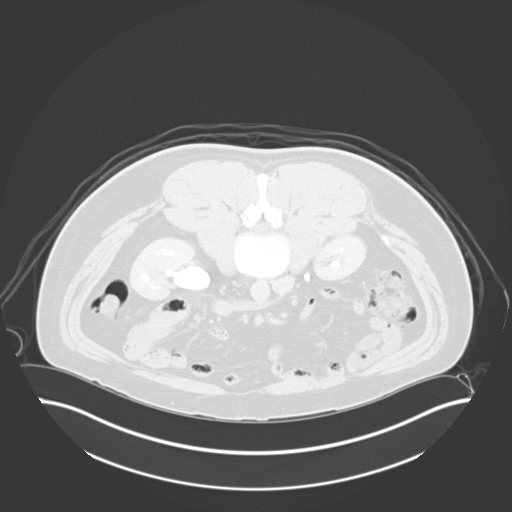
[im 79/119  soft-tissue]
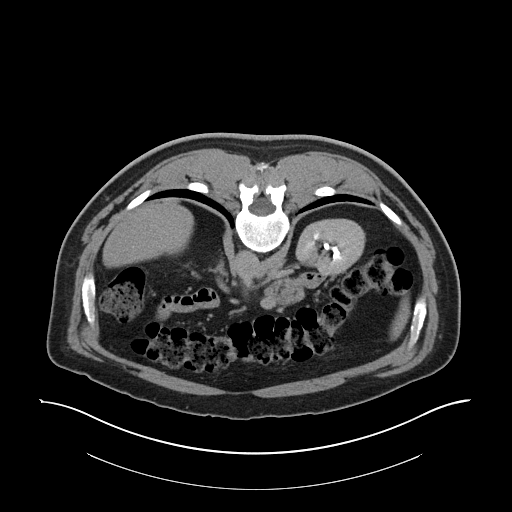
[im 79/119  lung]
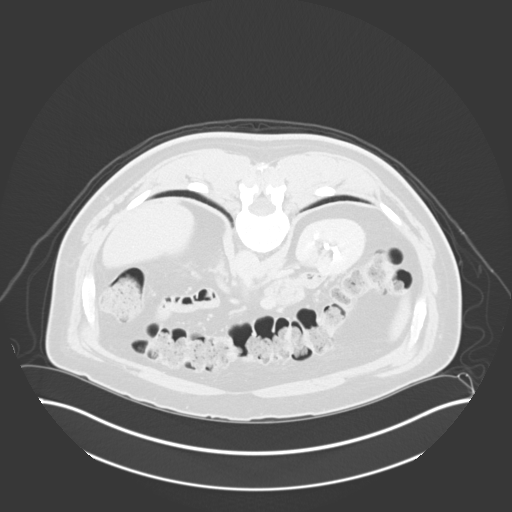
[im 92/119  soft-tissue]
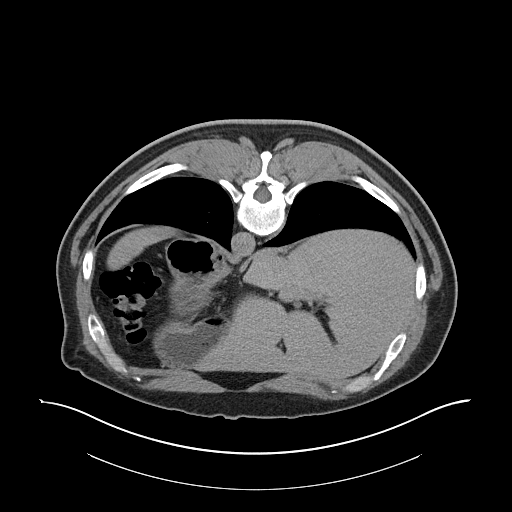
[im 92/119  lung]
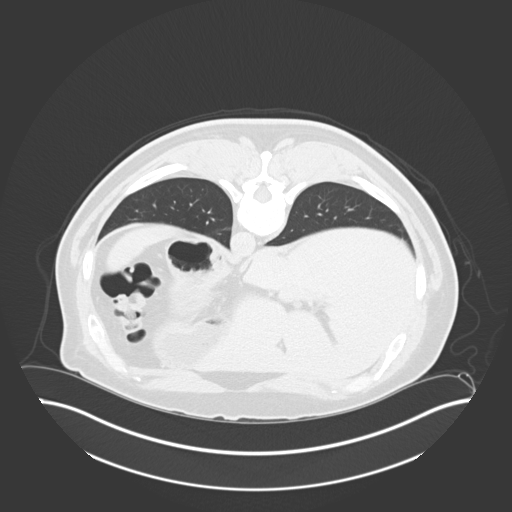
[im 105/119  soft-tissue]
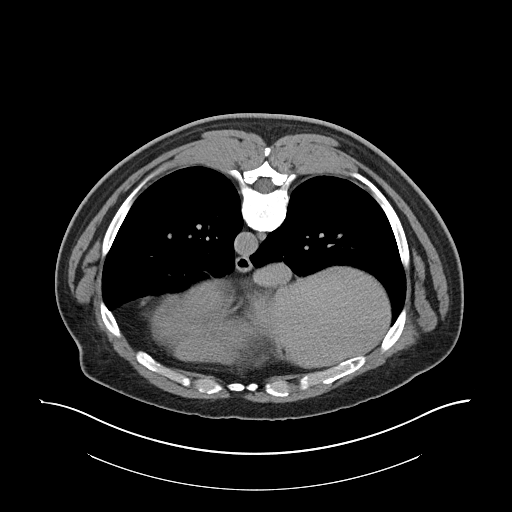
[im 105/119  lung]
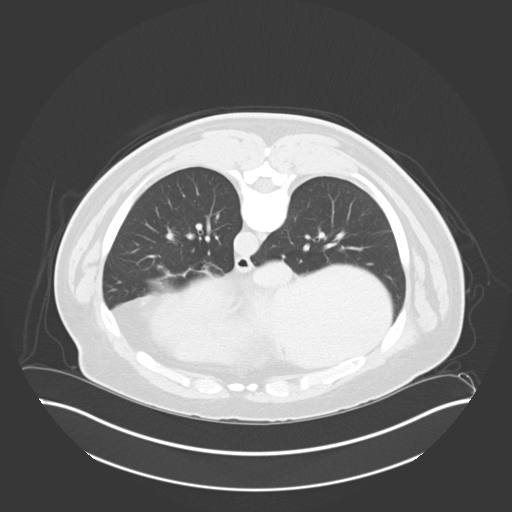

[10 of 46 positions shown; findings below may reference images not displayed]

FINDINGS: Lower chest: Scar like densities identified within the lung bases.

Hepatobiliary: No suspicious liver abnormalities identified. The
gallbladder appears normal. There is no biliary dilatation.

Pancreas: No inflammation or mass identified.

Spleen: The spleen appears normal.

Adrenals/Urinary Tract: Normal appearance of the adrenal glands.
Scar like density identified within the inferior pole of the right
kidney. There is left-sided pelvocaliectasis. Two stones within the
inferior pole the right kidney are noted measuring up to 8 mm. There
is no hydronephrosis or hydroureter. The urinary bladder appears
normal.

Stomach/Bowel: Stomach is within normal limits. No evidence of bowel
wall thickening, distention, or inflammatory changes. Appendix
appears normal.

Vascular/Lymphatic: Aortic atherosclerosis. No enlarged abdominal or
pelvic lymph nodes.

Reproductive: Prostate is unremarkable.

Other: No abdominal wall hernia or abnormality. No abdominopelvic
ascites.

Musculoskeletal: No acute or significant osseous findings.
IMPRESSION: 1. No acute findings.
2. Nonobstructing left renal calculi.
3. Left-sided pelvocaliectasis

## 2018-01-18 IMAGING — RF DG RETROGRADE PYELOGRAM
1 series · 1 of 1 positions shown · non-contrast
Comparison: CT 07/02/2016

CLINICAL DATA: Retrograde pyelogram Fluoro time: 38 sec dose-
mGy

EXAM:
RETROGRADE PYELOGRAM

[Series 1: run · 1 of 1 slices shown]
[im 1/1]
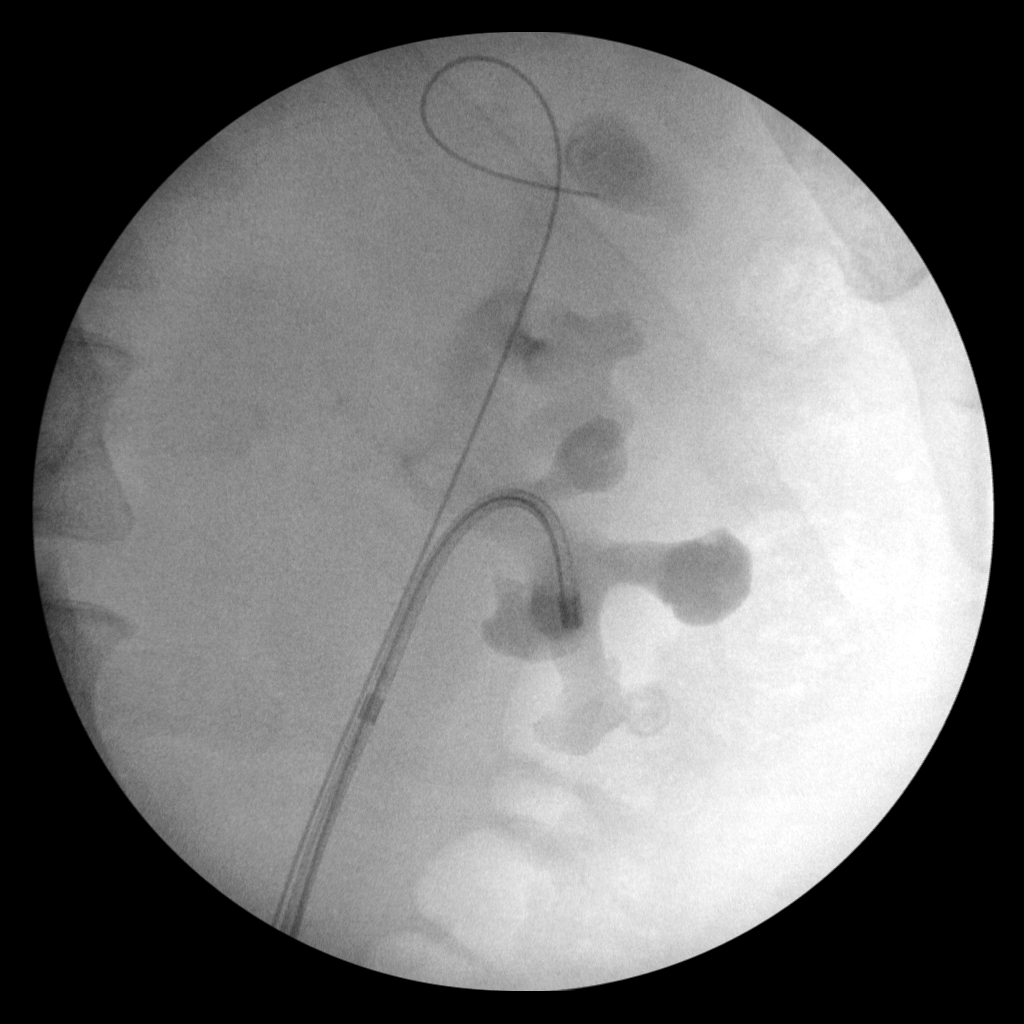

[1 of 1 positions shown; findings below may reference images not displayed]

FINDINGS: Single fluoroscopic spot image documents retrograde uretheroscope
advancement to the lower pole left renal collecting system. Parallel
guidewire extends to the upper pole renal collecting system. Calculi
are identified. There is dilute contrast centrally in the renal
collecting system.
IMPRESSION: Documentation of retrograde pyelography and ureteroscopy

## 2020-08-27 ENCOUNTER — Other Ambulatory Visit: Payer: BC Managed Care – PPO

## 2020-08-27 DIAGNOSIS — Z20822 Contact with and (suspected) exposure to covid-19: Secondary | ICD-10-CM

## 2020-08-28 LAB — SARS-COV-2, NAA 2 DAY TAT

## 2020-08-28 LAB — NOVEL CORONAVIRUS, NAA: SARS-CoV-2, NAA: NOT DETECTED

## 2020-11-26 ENCOUNTER — Other Ambulatory Visit: Payer: BC Managed Care – PPO

## 2020-11-26 DIAGNOSIS — Z20822 Contact with and (suspected) exposure to covid-19: Secondary | ICD-10-CM

## 2020-11-27 LAB — NOVEL CORONAVIRUS, NAA: SARS-CoV-2, NAA: NOT DETECTED

## 2020-11-27 LAB — SARS-COV-2, NAA 2 DAY TAT
